# Patient Record
Sex: Female | Born: 1990 | Race: Black or African American | Hispanic: No | Marital: Single | Smoking: Never smoker
Health system: Southern US, Community
[De-identification: ages and names within clinical notes are randomized; demographics above are authoritative.]

## PROBLEM LIST (undated history)

## (undated) DIAGNOSIS — I319 Disease of pericardium, unspecified: Secondary | ICD-10-CM

## (undated) DIAGNOSIS — K831 Obstruction of bile duct: Secondary | ICD-10-CM

## (undated) DIAGNOSIS — R51 Headache: Secondary | ICD-10-CM

## (undated) DIAGNOSIS — R519 Headache, unspecified: Secondary | ICD-10-CM

## (undated) DIAGNOSIS — K859 Acute pancreatitis without necrosis or infection, unspecified: Secondary | ICD-10-CM

## (undated) DIAGNOSIS — I5189 Other ill-defined heart diseases: Secondary | ICD-10-CM

## (undated) DIAGNOSIS — I10 Essential (primary) hypertension: Secondary | ICD-10-CM

## (undated) HISTORY — PX: APPENDECTOMY: SHX54

---
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
## 2010-12-02 ENCOUNTER — Ambulatory Visit (HOSPITAL_COMMUNITY)
Admission: EM | Admit: 2010-12-02 | Discharge: 2010-12-03 | Disposition: A | Discharge status: Home / Self Care | Payer: Self-pay | Attending: General Surgery | Admitting: General Surgery

## 2010-12-02 ENCOUNTER — Emergency Department (HOSPITAL_COMMUNITY): Payer: Self-pay

## 2010-12-02 DIAGNOSIS — R112 Nausea with vomiting, unspecified: Secondary | ICD-10-CM | POA: Insufficient documentation

## 2010-12-02 DIAGNOSIS — K6389 Other specified diseases of intestine: Secondary | ICD-10-CM | POA: Insufficient documentation

## 2010-12-02 DIAGNOSIS — R197 Diarrhea, unspecified: Secondary | ICD-10-CM | POA: Insufficient documentation

## 2010-12-02 DIAGNOSIS — R109 Unspecified abdominal pain: Secondary | ICD-10-CM | POA: Insufficient documentation

## 2010-12-02 DIAGNOSIS — K5900 Constipation, unspecified: Secondary | ICD-10-CM | POA: Insufficient documentation

## 2010-12-02 DIAGNOSIS — Z01812 Encounter for preprocedural laboratory examination: Secondary | ICD-10-CM | POA: Insufficient documentation

## 2010-12-02 LAB — DIFFERENTIAL
Basophils Absolute: 0.1 10*3/uL (ref 0.0–0.1)
Eosinophils Absolute: 0.1 10*3/uL (ref 0.0–0.7)
Eosinophils Relative: 2 % (ref 0–5)
Monocytes Absolute: 0.7 10*3/uL (ref 0.1–1.0)
Neutrophils Relative %: 57 % (ref 43–77)

## 2010-12-02 LAB — CBC
Platelets: 210 10*3/uL (ref 150–400)
RDW: 12 % (ref 11.5–15.5)
WBC: 5.7 10*3/uL (ref 4.0–10.5)

## 2010-12-02 LAB — URINALYSIS, ROUTINE W REFLEX MICROSCOPIC
Bilirubin Urine: NEGATIVE
Ketones, ur: NEGATIVE mg/dL
Nitrite: NEGATIVE
pH: 5.5 (ref 5.0–8.0)

## 2010-12-02 LAB — WET PREP, GENITAL: Trich, Wet Prep: NONE SEEN

## 2010-12-02 LAB — COMPREHENSIVE METABOLIC PANEL
Alkaline Phosphatase: 42 U/L (ref 39–117)
BUN: 9 mg/dL (ref 6–23)
GFR calc non Af Amer: >60 mL/min (ref >60)
Glucose, Bld: 93 mg/dL (ref 70–99)
Potassium: 4.2 mEq/L (ref 3.5–5.1)
Total Bilirubin: 0.5 mg/dL (ref 0.3–1.2)
Total Protein: 7.4 g/dL (ref 6.0–8.3)

## 2010-12-02 LAB — POCT PREGNANCY, URINE: Preg Test, Ur: NEGATIVE

## 2010-12-03 ENCOUNTER — Other Ambulatory Visit: Payer: Self-pay | Admitting: General Surgery

## 2010-12-03 NOTE — H&P (Signed)
NAMETIMICA, MARCOM              ACCOUNT NO.:  [id number]  MEDICAL RECORD NO.:  [id number]           PATIENT TYPE:  E  LOCATION:  WLED                         FACILITY:  [facility]  PHYSICIAN:  Angelia Mould. Derrell Lolling, M.D.DATE OF BIRTH:  1991-08-20  DATE OF ADMISSION:  12/02/2010 DATE OF DISCHARGE:                             HISTORY & PHYSICAL   CHIEF COMPLAINT:  Lower abdominal pain for 24 hours; nausea and vomiting, chronic.  HISTORY:  This is a 20 year old African-American female who is basically quite healthy.  She says that for 6 to 12 months she has had some chronic nausea and occasional vomiting.  Her bowel movements are alternating constipation and diarrhea.  There has been really no change in this lately.  What is new is that she now has a 24-hour history of lower abdominal pain which is more right sided than left sided.  She has not had any fever or chills.  She is voiding normally.  She has no vaginal discharge.  She does not think that she could be pregnant.  She came to emergency room.  A CT scan was done which shows a somewhat thickened appendix with some periappendiceal fat and soft tissue stranding suggesting appendicitis.  There were no other masses, there was no abscess, there was no free fluid.  The uterus and ovaries looked normal. White blood cell count is normal.  Urinalysis is negative.  Urine pregnancy test is negative.  I was called to see her.  PAST MEDICAL HISTORY:  No history of any surgical or medical problems. She has never been pregnant.  MEDICATIONS:  None.  DRUG ALLERGIES:  None known.  FAMILY HISTORY:  Mother living, has hypertension.  Father living, has coronary artery disease, has not had a coronary artery bypass graft.  No real familial diseases.  SOCIAL HISTORY:  She is single.  She works at [employer] as a Nature conservation officer. She smokes marijuana once or twice a day.  Denies alcohol.  REVIEW OF SYMPTOMS:  Ten systems review of systems is performed.  It  is basically negative except as described above.  Specifically, no history of any urologic problems, gynecologic problems, pulmonary disease, diabetes.  She had not had any vaginal discharge.  PHYSICAL EXAMINATION:  GENERAL:  Healthy, somewhat athletic appearing woman in mild distress. VITAL SIGNS:  Temperature 98.6.  Initial pulse 107, now 88; respirations 18 and unlabored, blood pressure 145/95. HEENT:  Eyes, sclerae are clear.  Extraocular movements intact.  Ears, nose, mouth and throat; nose, lips, tongue and oropharynx without gross lesions. NECK:  Supple, nontender.  No mass, no jugular venous distention. LUNGS:  Clear to auscultation.  No chest wall tenderness. HEART:  Regular rate and rhythm.  No murmur.  No ectopy.  Radial and femoral pulses are palpable. BREASTS:  Not examined. ABDOMEN:  Soft.  Somewhat tender throughout the entire mid and lower abdomen but more so on the right and left with some guarding.  I do not feel a mass.  There is no hernia.  There are no scars noted.  There is no inguinal adenopathy or mass.  She moves all 4 extremities well without pain  or deformity. NEUROLOGIC:  No gross motor or sensory deficits.  DATE OF ADMISSION:  CT scan and lab work as discussed above.  ASSESSMENT: 1. Acute lower abdominal pain.  Considering the onset of pain and the     CT scan findings, there is moderate to high probability that this     is acute appendicitis.  Other possibilities would include     salpingitis, gastroenteritis, Meckel's diverticulum, these are felt     to be less likely. 2. Chronic irritable bowel syndrome with alternating diarrhea,     constipation and some nausea.  PLAN: 1. The patient be taken to operating room for diagnostic laparoscopy     and laparoscopic appendectomy. 2. I have discussed the indications and details of surgery with the     patient as well as her mother.  Differential diagnosis was     discussed.  They are aware that this is  probably appendicitis but     we may find something else wrong that may direct [place] to alter our     surgical plan while in the operating room.  Risks were discussed, including but not limited to bleeding, infection, conversion to open laparotomy, intestinal resection if necessary, injury to adjacent organs such as bladder or uterus or intestine with major reconstructive surgery, cardiac or pulmonary thromboembolic problems. The patient seems to understand these issues well.  This time all questions are answered.  She is in full agreement with this plan.     Angelia Mould. Derrell Lolling, M.D.     HMI/MEDQ  D:  12/03/2010  T:  12/03/2010  Job:  [id number]  Electronically Signed by Claud Kelp M.D. on 12/03/2010 06:49:52 AM

## 2010-12-06 NOTE — Op Note (Signed)
Chelsey Hall, Chelsey Hall              ACCOUNT NO.:  [id number]  MEDICAL RECORD NO.:  [id number]           PATIENT TYPE:  I  LOCATION:  1530                         FACILITY:  [facility]  PHYSICIAN:  Angelia Mould. Derrell Lolling, M.D.DATE OF BIRTH:  1991/08/25  DATE OF PROCEDURE:  12/03/2010 DATE OF DISCHARGE:                              OPERATIVE REPORT   PREOPERATIVE DIAGNOSIS:  Acute appendicitis.  POSTOPERATIVE DIAGNOSIS:  Acute appendicitis.  OPERATION PERFORMED:  Laparoscopic appendectomy.  SURGEON:  Angelia Mould. Derrell Lolling, M.D.  OPERATIVE INDICATIONS:  This is a 20 year old [place] American female who is generally healthy.  She presents with a 24-hour history of lower abdominal pain, but no fever, chills.  She has chronic problems with alternating diarrhea and constipation.  She has a 1-year history of intermittent mild nausea and occasional vomiting of unknown cause.  She has not sought medical attention.  She has no prior medical or surgical problems.  On exam, she has tenderness in the lower abdomen, more so on the right than in the left with some guarding.  CT scan shows a somewhat thickened appendix with a little bit of stranding around it.  Lab work including CBC, urine pregnancy test, and urinalysis were basically normal.  I felt that the clinical history and physical findings and CT scan were most consistent with appendicitis.  She was brought to operating room for diagnostic laparoscopy and probable appendectomy.  OPERATIVE FINDINGS:  The appendix was inflamed, a little bit edematous, a little bit swollen, certainly erythematous.  There was no evidence of perforation or gangrene.  The last 3 feet of terminal ileum looked basically normal, although there was a little bit of secondary inflammation just adjacent to the appendix.  The cecum and right colon looked normal.  The uterus and both fallopian tubes and both ovaries looked normal.  There was a little bit of watery cloudy fluid in  the pelvis, but this was not purulent.  There was no exudate.  Sigmoid colon looked normal.  The liver and gallbladder looked normal.  OPERATIVE TECHNIQUE:  Following the induction of general endotracheal anesthesia, Foley catheter was inserted.  Intravenous antibiotics were given.  The abdomen was prepped and draped in the sterile fashion. Surgical time-out was held identifying correct patient and correct procedure.  0.25% Marcaine with epinephrine was used as a local infiltration anesthetic.  A vertically oriented incision was made at the superior rim of the umbilicus.  Fascia was incised in the midline and the abdominal cavity entered under direct vision.  An 11 mm Hassan trocar was inserted and secured with a pursestring suture of 0 Vicryl.  Pneumoperitoneum was created.  Video cam was inserted with visualization and findings as described above.  A 5 mm trocar was placed in the left lower quadrant and a 12 mm trocar placed in the suprapubic area.  I thoroughly explored the upper abdomen, lower abdomen, and pelvis with findings as described above.  The only pathologic abnormalities seemed to be the appendix.  We elevated the appendix with a grasper.  We carefully took down the peritoneum around the appendix.  We divided the appendiceal mesentery and the  appendiceal artery at the harmonic scalpel.  He did this in several small steps until we had completely skeletonized the appendix and could clearly identify the junction of the appendix with the base of the cecum.  An Endo GIA stapler with a 2.5 cm staple height was inserted and placed transversely across the base of the appendix at the level of the cecum.  Once we had good visualization, we closed the stapler, held in place for about 30 seconds, fired and removed it.  The appendix was placed in a specimen bag and removed.  There were a few loose staples that were removed.  We irrigated the pericecal area and the pelvis and removed a  little bit of clear watery fluid.  There was no bleeding and no abnormality whatsoever.  The staple line looked very good.  The trocars were removed under direct vision.  There is no bleeding from the trocar sites.  The pneumoperitoneum was released.  The fascia at the umbilicus was closed with 0-Vicryl sutures.  The skin incisions were closed with subcuticular sutures of 4-0 Monocryl and Dermabond.  The patient tolerated the procedure well and was taken to recovery room in stable condition.  Estimated blood loss was about 10 cc.  Complications None.  Sponge, needle, and instrument counts were correct.     Angelia Mould. Derrell Lolling, M.D.     HMI/MEDQ  D:  12/03/2010  T:  12/03/2010  Job:  [id number]  Electronically Signed by Claud Kelp M.D. on 12/06/2010 10:18:31 AM

## 2012-03-25 ENCOUNTER — Emergency Department (HOSPITAL_BASED_OUTPATIENT_CLINIC_OR_DEPARTMENT_OTHER): Payer: Self-pay

## 2012-03-25 ENCOUNTER — Encounter (HOSPITAL_BASED_OUTPATIENT_CLINIC_OR_DEPARTMENT_OTHER): Payer: Self-pay | Admitting: *Deleted

## 2012-03-25 ENCOUNTER — Emergency Department (HOSPITAL_BASED_OUTPATIENT_CLINIC_OR_DEPARTMENT_OTHER)
Admission: EM | Admit: 2012-03-25 | Discharge: 2012-03-25 | Disposition: A | Discharge status: Home / Self Care | Payer: Self-pay | Attending: Emergency Medicine | Admitting: Emergency Medicine

## 2012-03-25 DIAGNOSIS — N390 Urinary tract infection, site not specified: Secondary | ICD-10-CM | POA: Insufficient documentation

## 2012-03-25 DIAGNOSIS — R109 Unspecified abdominal pain: Secondary | ICD-10-CM | POA: Insufficient documentation

## 2012-03-25 DIAGNOSIS — R112 Nausea with vomiting, unspecified: Secondary | ICD-10-CM | POA: Insufficient documentation

## 2012-03-25 LAB — URINALYSIS, ROUTINE W REFLEX MICROSCOPIC
Bilirubin Urine: NEGATIVE
Glucose, UA: NEGATIVE mg/dL
Ketones, ur: 15 mg/dL — AB
Specific Gravity, Urine: 1.027 (ref 1.005–1.030)
pH: 6 (ref 5.0–8.0)

## 2012-03-25 LAB — CBC WITH DIFFERENTIAL/PLATELET
Basophils Absolute: 0 10*3/uL (ref 0.0–0.1)
Basophils Relative: 0 % (ref 0–1)
Eosinophils Relative: 1 % (ref 0–5)
Lymphocytes Relative: 40 % (ref 12–46)
MCHC: 36.4 g/dL — ABNORMAL HIGH (ref 30.0–36.0)
MCV: 87.8 fL (ref 78.0–100.0)
Monocytes Absolute: 0.4 10*3/uL (ref 0.1–1.0)
Platelets: 193 10*3/uL (ref 150–400)
RDW: 11.3 % — ABNORMAL LOW (ref 11.5–15.5)
WBC: 3.7 10*3/uL — ABNORMAL LOW (ref 4.0–10.5)

## 2012-03-25 LAB — URINE MICROSCOPIC-ADD ON

## 2012-03-25 LAB — COMPREHENSIVE METABOLIC PANEL
ALT: 14 U/L (ref 0–35)
AST: 15 U/L (ref 0–37)
Albumin: 4.7 g/dL (ref 3.5–5.2)
CO2: 24 mEq/L (ref 19–32)
Calcium: 9.5 mg/dL (ref 8.4–10.5)
Creatinine, Ser: 0.8 mg/dL (ref 0.50–1.10)
GFR calc non Af Amer: >90 mL/min (ref >90)
Sodium: 138 mEq/L (ref 135–145)
Total Protein: 7.5 g/dL (ref 6.0–8.3)

## 2012-03-25 MED ORDER — OMEPRAZOLE 20 MG PO CPDR: 20.0000 mg | DELAYED_RELEASE_CAPSULE | Freq: Every day | ORAL | Status: DC

## 2012-03-25 MED ORDER — NITROFURANTOIN MONOHYD MACRO 100 MG PO CAPS: 100.0000 mg | ORAL_CAPSULE | Freq: Two times a day (BID) | ORAL | Status: AC

## 2012-03-25 NOTE — ED Provider Notes (Signed)
Medical screening examination/treatment/procedure(s) were performed by non-physician practitioner and as supervising physician I was immediately available for consultation/collaboration.   Glynn Octave, MD 03/25/12 [phone]

## 2012-03-25 NOTE — ED Notes (Signed)
Pt. Is in [place] for test ordered by EDP

## 2012-03-25 NOTE — ED Notes (Signed)
Pt. Reports normal periods with no discharge and no odor.  Pt. Reports no frequency.  Pt. Reports last sexual encounter was in Feb. 2013

## 2012-03-25 NOTE — ED Provider Notes (Signed)
Medical screening examination/treatment/procedure(s) were performed by non-physician practitioner and as supervising physician I was immediately available for consultation/collaboration.   Glynn Octave, MD 03/25/12 1750

## 2012-03-25 NOTE — ED Provider Notes (Addendum)
History     CSN: [id number]  Arrival date & time 03/25/12  1227   First MD Initiated Contact with Patient 03/25/12 1320      Chief Complaint  Patient presents with  . Abdominal Pain    mid abd. pain per Pt. has been hurting for approx. 5-6 mths with noted back pain also.    (Consider location/radiation/quality/duration/timing/severity/associated sxs/prior treatment) Patient is a 21 y.o. female presenting with abdominal pain. The history is provided by the patient. No language interpreter was used.  Abdominal Pain The primary symptoms of the illness include abdominal pain, nausea and vomiting. Episode onset: on and off for 7 months. The onset of the illness was gradual. The problem has been gradually worsening.  The patient states that she believes she is currently not pregnant. Significant associated medical issues do not include PUD, GERD or gallstones.    No past medical history on file.  Past Surgical History  Procedure Date  . Appendectomy     No family history on file.  History  Substance Use Topics  . Smoking status: Not on file  . Smokeless tobacco: Not on file  . Alcohol Use:     OB History    Grav Para Term Preterm Abortions TAB SAB Ect Mult Living                  Review of Systems  Unable to perform ROS Gastrointestinal: Positive for nausea, vomiting and abdominal pain.  All other systems reviewed and are negative.    Allergies  Review of patient's allergies indicates no known allergies.  Home Medications  No current outpatient prescriptions on file.  LMP 03/07/2012  Physical Exam  Nursing note and vitals reviewed. Constitutional: She is oriented to person, place, and time. She appears well-developed and well-nourished.  HENT:  Head: Normocephalic.  Right Ear: External ear normal.  Left Ear: External ear normal.  Eyes: Conjunctivae are normal. Pupils are equal, round, and reactive to light.  Neck: Normal range of motion. Neck supple.    Cardiovascular: Normal rate and regular rhythm.   Pulmonary/Chest: Effort normal and breath sounds normal.  Abdominal: Soft. There is tenderness.  Musculoskeletal: Normal range of motion.  Neurological: She is alert and oriented to person, place, and time. She has normal reflexes.  Skin: Skin is warm.  Psychiatric: She has a normal mood and affect.    ED Course  Procedures (including critical care time)   Labs Reviewed  PREGNANCY, URINE  URINALYSIS, ROUTINE W REFLEX MICROSCOPIC   No results found. Results for orders placed during the hospital encounter of 03/25/12  URINALYSIS, ROUTINE W REFLEX MICROSCOPIC      Component Value Range   Color, Urine YELLOW  YELLOW   APPearance CLOUDY (*) CLEAR   Specific Gravity, Urine 1.027  1.005 - 1.030   pH 6.0  5.0 - 8.0   Glucose, UA NEGATIVE  NEGATIVE mg/dL   Hgb urine dipstick NEGATIVE  NEGATIVE   Bilirubin Urine NEGATIVE  NEGATIVE   Ketones, ur 15 (*) NEGATIVE mg/dL   Protein, ur NEGATIVE  NEGATIVE mg/dL   Urobilinogen, UA 0.2  0.0 - 1.0 mg/dL   Nitrite NEGATIVE  NEGATIVE   Leukocytes, UA MODERATE (*) NEGATIVE  PREGNANCY, URINE      Component Value Range   Preg Test, Ur NEGATIVE  NEGATIVE  URINE MICROSCOPIC-ADD ON      Component Value Range   Squamous Epithelial / LPF MANY (*) RARE   WBC, UA 11-20  <3  WBC/hpf   RBC / HPF 0-2  <3 RBC/hpf   Bacteria, UA MANY (*) RARE   Urine-Other MUCOUS PRESENT    CBC WITH DIFFERENTIAL      Component Value Range   WBC 3.7 (*) 4.0 - 10.5 K/uL   RBC 4.03  3.87 - 5.11 MIL/uL   Hemoglobin 12.9  12.0 - 15.0 g/dL   HCT 78.2 (*) 95.6 - 21.3 %   MCV 87.8  78.0 - 100.0 fL   MCH 32.0  26.0 - 34.0 pg   MCHC 36.4 (*) 30.0 - 36.0 g/dL   RDW 08.6 (*) 57.8 - 46.9 %   Platelets 193  150 - 400 K/uL   Neutrophils Relative 49  43 - 77 %   Neutro Abs 1.8  1.7 - 7.7 K/uL   Lymphocytes Relative 40  12 - 46 %   Lymphs Abs 1.5  0.7 - 4.0 K/uL   Monocytes Relative 10  3 - 12 %   Monocytes Absolute 0.4  0.1 -  1.0 K/uL   Eosinophils Relative 1  0 - 5 %   Eosinophils Absolute 0.0  0.0 - 0.7 K/uL   Basophils Relative 0  0 - 1 %   Basophils Absolute 0.0  0.0 - 0.1 K/uL  COMPREHENSIVE METABOLIC PANEL      Component Value Range   Sodium 138  135 - 145 mEq/L   Potassium 3.6  3.5 - 5.1 mEq/L   Chloride 104  96 - 112 mEq/L   CO2 24  19 - 32 mEq/L   Glucose, Bld 97  70 - 99 mg/dL   BUN 9  6 - 23 mg/dL   Creatinine, Ser 6.29  0.50 - 1.10 mg/dL   Calcium 9.5  8.4 - 52.8 mg/dL   Total Protein 7.5  6.0 - 8.3 g/dL   Albumin 4.7  3.5 - 5.2 g/dL   AST 15  0 - 37 U/L   ALT 14  0 - 35 U/L   Alkaline Phosphatase 40  39 - 117 U/L   Total Bilirubin 0.7  0.3 - 1.2 mg/dL   GFR calc non Af Amer >90  >90 mL/min   GFR calc Af Amer >90  >90 mL/min  LIPASE, BLOOD      Component Value Range   Lipase 32  11 - 59 U/L   No results found.   No diagnosis found.    MDM     Results for orders placed during the hospital encounter of 03/25/12  URINALYSIS, ROUTINE W REFLEX MICROSCOPIC      Component Value Range   Color, Urine YELLOW  YELLOW   APPearance CLOUDY (*) CLEAR   Specific Gravity, Urine 1.027  1.005 - 1.030   pH 6.0  5.0 - 8.0   Glucose, UA NEGATIVE  NEGATIVE mg/dL   Hgb urine dipstick NEGATIVE  NEGATIVE   Bilirubin Urine NEGATIVE  NEGATIVE   Ketones, ur 15 (*) NEGATIVE mg/dL   Protein, ur NEGATIVE  NEGATIVE mg/dL   Urobilinogen, UA 0.2  0.0 - 1.0 mg/dL   Nitrite NEGATIVE  NEGATIVE   Leukocytes, UA MODERATE (*) NEGATIVE  PREGNANCY, URINE      Component Value Range   Preg Test, Ur NEGATIVE  NEGATIVE  URINE MICROSCOPIC-ADD ON      Component Value Range   Squamous Epithelial / LPF MANY (*) RARE   WBC, UA 11-20  <3 WBC/hpf   RBC / HPF 0-2  <3 RBC/hpf   Bacteria, UA MANY (*)  RARE   Urine-Other MUCOUS PRESENT    CBC WITH DIFFERENTIAL      Component Value Range   WBC 3.7 (*) 4.0 - 10.5 K/uL   RBC 4.03  3.87 - 5.11 MIL/uL   Hemoglobin 12.9  12.0 - 15.0 g/dL   HCT 95.6 (*) 21.3 - 08.6 %   MCV  87.8  78.0 - 100.0 fL   MCH 32.0  26.0 - 34.0 pg   MCHC 36.4 (*) 30.0 - 36.0 g/dL   RDW 57.8 (*) 46.9 - 62.9 %   Platelets 193  150 - 400 K/uL   Neutrophils Relative 49  43 - 77 %   Neutro Abs 1.8  1.7 - 7.7 K/uL   Lymphocytes Relative 40  12 - 46 %   Lymphs Abs 1.5  0.7 - 4.0 K/uL   Monocytes Relative 10  3 - 12 %   Monocytes Absolute 0.4  0.1 - 1.0 K/uL   Eosinophils Relative 1  0 - 5 %   Eosinophils Absolute 0.0  0.0 - 0.7 K/uL   Basophils Relative 0  0 - 1 %   Basophils Absolute 0.0  0.0 - 0.1 K/uL  COMPREHENSIVE METABOLIC PANEL      Component Value Range   Sodium 138  135 - 145 mEq/L   Potassium 3.6  3.5 - 5.1 mEq/L   Chloride 104  96 - 112 mEq/L   CO2 24  19 - 32 mEq/L   Glucose, Bld 97  70 - 99 mg/dL   BUN 9  6 - 23 mg/dL   Creatinine, Ser 5.28  0.50 - 1.10 mg/dL   Calcium 9.5  8.4 - 41.3 mg/dL   Total Protein 7.5  6.0 - 8.3 g/dL   Albumin 4.7  3.5 - 5.2 g/dL   AST 15  0 - 37 U/L   ALT 14  0 - 35 U/L   Alkaline Phosphatase 40  39 - 117 U/L   Total Bilirubin 0.7  0.3 - 1.2 mg/dL   GFR calc non Af Amer >90  >90 mL/min   GFR calc Af Amer >90  >90 mL/min  LIPASE, BLOOD      Component Value Range   Lipase 32  11 - 59 U/L   US Abdomen Complete  03/25/2012  *RADIOLOGY REPORT*  Clinical Data:  Upper abdominal pain, prior appendectomy  ABDOMINAL ULTRASOUND COMPLETE  Comparison:  12/02/2010 CT without contrast  Findings:  Gallbladder:  No gallstones, gallbladder wall thickening, or pericholecystic fluid.  Common Bile Duct:  Within normal limits in caliber.  Liver: Diffuse increased echogenicity compatible with mild hepatic steatosis.  No focal abnormality or biliary dilatation.  IVC:  Appears normal.  Pancreas:  No abnormality identified.  Spleen:  Within normal limits in size and echotexture.  Right kidney:  Normal in size and parenchymal echogenicity.  No evidence of mass or hydronephrosis.  Left kidney:  Normal in size and parenchymal echogenicity.  No evidence of mass or  hydronephrosis.  Abdominal Aorta:  No aneurysm identified.  IMPRESSION: Mild hepatic steatosis or fatty infiltration.  No acute finding by ultrasound.  Original Report Authenticated By: Judie Petit. Ruel Favors, M.D.     Ultrasound is normal.   I will treat with prilosec for symptoms.   Pt also treated for uti with macrobid.   Pt given referral to Dr. Rodena Medin if symptoms persist   Lonia Skinner [place], [place] 03/25/12 1458  Lonia Skinner [place], [place] 03/25/12 [phone]

## 2012-04-15 ENCOUNTER — Emergency Department (HOSPITAL_COMMUNITY)
Admission: EM | Admit: 2012-04-15 | Discharge: 2012-04-16 | Disposition: A | Discharge status: Home / Self Care | Payer: Self-pay | Attending: Emergency Medicine | Admitting: Emergency Medicine

## 2012-04-15 ENCOUNTER — Encounter (HOSPITAL_COMMUNITY): Payer: Self-pay | Admitting: Emergency Medicine

## 2012-04-15 ENCOUNTER — Emergency Department (HOSPITAL_COMMUNITY): Payer: Self-pay

## 2012-04-15 DIAGNOSIS — R109 Unspecified abdominal pain: Secondary | ICD-10-CM | POA: Insufficient documentation

## 2012-04-15 DIAGNOSIS — R112 Nausea with vomiting, unspecified: Secondary | ICD-10-CM | POA: Insufficient documentation

## 2012-04-15 DIAGNOSIS — Z9089 Acquired absence of other organs: Secondary | ICD-10-CM | POA: Insufficient documentation

## 2012-04-15 DIAGNOSIS — F17200 Nicotine dependence, unspecified, uncomplicated: Secondary | ICD-10-CM | POA: Insufficient documentation

## 2012-04-15 LAB — URINALYSIS, ROUTINE W REFLEX MICROSCOPIC
Bilirubin Urine: NEGATIVE
Hgb urine dipstick: NEGATIVE
Nitrite: NEGATIVE
Protein, ur: NEGATIVE mg/dL
Urobilinogen, UA: 0.2 mg/dL (ref 0.0–1.0)

## 2012-04-15 LAB — COMPREHENSIVE METABOLIC PANEL
ALT: 11 U/L (ref 0–35)
AST: 12 U/L (ref 0–37)
CO2: 22 mEq/L (ref 19–32)
Calcium: 9.6 mg/dL (ref 8.4–10.5)
Chloride: 102 mEq/L (ref 96–112)
Creatinine, Ser: 0.72 mg/dL (ref 0.50–1.10)
GFR calc Af Amer: >90 mL/min (ref >90)
GFR calc non Af Amer: >90 mL/min (ref >90)
Glucose, Bld: 92 mg/dL (ref 70–99)
Total Bilirubin: 0.4 mg/dL (ref 0.3–1.2)

## 2012-04-15 LAB — CBC WITH DIFFERENTIAL/PLATELET
Eosinophils Absolute: 0 10*3/uL (ref 0.0–0.7)
Eosinophils Relative: 0 % (ref 0–5)
HCT: 35.9 % — ABNORMAL LOW (ref 36.0–46.0)
Hemoglobin: 13.1 g/dL (ref 12.0–15.0)
Lymphocytes Relative: 12 % (ref 12–46)
Lymphs Abs: 0.7 10*3/uL (ref 0.7–4.0)
MCH: 32 pg (ref 26.0–34.0)
MCV: 87.8 fL (ref 78.0–100.0)
Monocytes Absolute: 0.6 10*3/uL (ref 0.1–1.0)
Monocytes Relative: 10 % (ref 3–12)
Platelets: 184 10*3/uL (ref 150–400)
RBC: 4.09 MIL/uL (ref 3.87–5.11)
WBC: 5.8 10*3/uL (ref 4.0–10.5)

## 2012-04-15 LAB — LIPASE, BLOOD: Lipase: 25 U/L (ref 11–59)

## 2012-04-15 MED ORDER — DICYCLOMINE HCL 10 MG/ML IM SOLN
20.0000 mg | Freq: Once | INTRAMUSCULAR | Status: AC
  Administered 2012-04-15: 20 mg via INTRAMUSCULAR
  Filled 2012-04-15: qty 2

## 2012-04-15 MED ORDER — POTASSIUM CHLORIDE CRYS ER 20 MEQ PO TBCR
20.0000 meq | EXTENDED_RELEASE_TABLET | Freq: Once | ORAL | Status: AC
  Administered 2012-04-16: 20 meq via ORAL
  Filled 2012-04-15: qty 1

## 2012-04-15 MED ORDER — ONDANSETRON HCL 4 MG/2ML IJ SOLN
4.0000 mg | Freq: Once | INTRAMUSCULAR | Status: AC
  Administered 2012-04-15: 4 mg via INTRAVENOUS
  Filled 2012-04-15: qty 2

## 2012-04-15 MED ORDER — SODIUM CHLORIDE 0.9 % IV SOLN
INTRAVENOUS | Status: DC
  Administered 2012-04-15: 22:00:00 via INTRAVENOUS

## 2012-04-15 MED ORDER — HYDROMORPHONE HCL PF 1 MG/ML IJ SOLN
1.0000 mg | Freq: Once | INTRAMUSCULAR | Status: AC
  Administered 2012-04-15: 1 mg via INTRAVENOUS
  Filled 2012-04-15: qty 1

## 2012-04-15 NOTE — ED Notes (Signed)
Pt states she is having abd pain  Pt states she had her appendix removed a year ago and has been having pain off and on ever since  Pt states she was seen here Aug 1st and was diagnosed with a UTI and was given antibiotics  Pt states it cleared up but now she is having pain in her abd and having N/V/D  Pt states she is unable to keep anything down

## 2012-04-16 ENCOUNTER — Emergency Department (HOSPITAL_COMMUNITY)
Admission: EM | Admit: 2012-04-16 | Discharge: 2012-04-16 | Disposition: A | Discharge status: Home / Self Care | Payer: Self-pay | Attending: Emergency Medicine | Admitting: Emergency Medicine

## 2012-04-16 ENCOUNTER — Encounter (HOSPITAL_COMMUNITY): Payer: Self-pay | Admitting: *Deleted

## 2012-04-16 DIAGNOSIS — G8929 Other chronic pain: Secondary | ICD-10-CM | POA: Insufficient documentation

## 2012-04-16 DIAGNOSIS — Z9089 Acquired absence of other organs: Secondary | ICD-10-CM | POA: Insufficient documentation

## 2012-04-16 DIAGNOSIS — R1011 Right upper quadrant pain: Secondary | ICD-10-CM | POA: Insufficient documentation

## 2012-04-16 LAB — CBC WITH DIFFERENTIAL/PLATELET
HCT: 34.8 % — ABNORMAL LOW (ref 36.0–46.0)
Hemoglobin: 12.4 g/dL (ref 12.0–15.0)
Lymphocytes Relative: 10 % — ABNORMAL LOW (ref 12–46)
MCHC: 35.6 g/dL (ref 30.0–36.0)
Monocytes Absolute: 0.5 10*3/uL (ref 0.1–1.0)
Monocytes Relative: 10 % (ref 3–12)
Neutro Abs: 4 10*3/uL (ref 1.7–7.7)
WBC: 5 10*3/uL (ref 4.0–10.5)

## 2012-04-16 LAB — URINALYSIS, ROUTINE W REFLEX MICROSCOPIC
Bilirubin Urine: NEGATIVE
Glucose, UA: NEGATIVE mg/dL
Ketones, ur: >80 mg/dL — AB
pH: 6 (ref 5.0–8.0)

## 2012-04-16 LAB — BASIC METABOLIC PANEL
BUN: 7 mg/dL (ref 6–23)
CO2: 21 mEq/L (ref 19–32)
Chloride: 102 mEq/L (ref 96–112)
Creatinine, Ser: 0.72 mg/dL (ref 0.50–1.10)

## 2012-04-16 LAB — URINE MICROSCOPIC-ADD ON

## 2012-04-16 LAB — HEPATIC FUNCTION PANEL
ALT: 11 U/L (ref 0–35)
Albumin: 3.9 g/dL (ref 3.5–5.2)
Indirect Bilirubin: 0.4 mg/dL (ref 0.3–0.9)
Total Protein: 7.2 g/dL (ref 6.0–8.3)

## 2012-04-16 MED ORDER — ONDANSETRON HCL 4 MG/2ML IJ SOLN
4.0000 mg | Freq: Once | INTRAMUSCULAR | Status: AC
  Administered 2012-04-16: 4 mg via INTRAVENOUS
  Filled 2012-04-16: qty 2

## 2012-04-16 MED ORDER — HYDROMORPHONE HCL PF 1 MG/ML IJ SOLN
1.0000 mg | Freq: Once | INTRAMUSCULAR | Status: AC
  Administered 2012-04-16: 1 mg via INTRAVENOUS
  Filled 2012-04-16: qty 1

## 2012-04-16 MED ORDER — IOHEXOL 300 MG/ML  SOLN
100.0000 mL | Freq: Once | INTRAMUSCULAR | Status: AC | PRN
  Administered 2012-04-16: 100 mL via INTRAVENOUS

## 2012-04-16 MED ORDER — METOCLOPRAMIDE HCL 5 MG/ML IJ SOLN
10.0000 mg | Freq: Once | INTRAMUSCULAR | Status: AC
  Administered 2012-04-16: 10 mg via INTRAVENOUS
  Filled 2012-04-16: qty 2

## 2012-04-16 MED ORDER — ONDANSETRON 4 MG PO TBDP
ORAL_TABLET | ORAL | Status: AC
  Filled 2012-04-16: qty 1

## 2012-04-16 MED ORDER — HYOSCYAMINE SULFATE 0.125 MG PO TABS
0.2500 mg | ORAL_TABLET | Freq: Once | ORAL | Status: AC
  Administered 2012-04-16: 0.25 mg via ORAL
  Filled 2012-04-16: qty 1

## 2012-04-16 MED ORDER — PANTOPRAZOLE SODIUM 40 MG IV SOLR
40.0000 mg | Freq: Once | INTRAVENOUS | Status: AC
  Administered 2012-04-16: 40 mg via INTRAVENOUS
  Filled 2012-04-16: qty 40

## 2012-04-16 MED ORDER — SODIUM CHLORIDE 0.9 % IV BOLUS (SEPSIS): 1000.0000 mL | Freq: Once | INTRAVENOUS | Status: DC

## 2012-04-16 MED ORDER — HYDROCODONE-ACETAMINOPHEN 5-500 MG PO TABS: 1.0000 | ORAL_TABLET | Freq: Four times a day (QID) | ORAL | Status: DC | PRN

## 2012-04-16 MED ORDER — POTASSIUM CHLORIDE CRYS ER 20 MEQ PO TBCR
40.0000 meq | EXTENDED_RELEASE_TABLET | Freq: Once | ORAL | Status: AC
  Administered 2012-04-16: 40 meq via ORAL
  Filled 2012-04-16: qty 2

## 2012-04-16 MED ORDER — ONDANSETRON HCL 4 MG PO TABS: 4.0000 mg | ORAL_TABLET | Freq: Four times a day (QID) | ORAL | Status: DC

## 2012-04-16 MED ORDER — SODIUM CHLORIDE 0.9 % IR SOLN: 1000.0000 mL | Freq: Once | Status: DC

## 2012-04-16 MED ORDER — ONDANSETRON 4 MG PO TBDP
4.0000 mg | ORAL_TABLET | Freq: Once | ORAL | Status: AC
  Administered 2012-04-16: 4 mg via ORAL

## 2012-04-16 MED ORDER — DIPHENHYDRAMINE HCL 50 MG/ML IJ SOLN
25.0000 mg | Freq: Once | INTRAMUSCULAR | Status: AC
  Administered 2012-04-16: 25 mg via INTRAVENOUS
  Filled 2012-04-16: qty 1

## 2012-04-16 MED ORDER — GI COCKTAIL ~~LOC~~
30.0000 mL | Freq: Once | ORAL | Status: AC
  Administered 2012-04-16: 30 mL via ORAL
  Filled 2012-04-16: qty 30

## 2012-04-16 NOTE — ED Provider Notes (Signed)
History     CSN: [id number]  Arrival date & time 04/15/12  2012   First MD Initiated Contact with Patient 04/15/12 2121      Chief Complaint  Patient presents with  . Headache  . Abdominal Pain    (Consider location/radiation/quality/duration/timing/severity/associated sxs/prior treatment) HPI  Pt to the ER with complaints of bilateral upper quadrant abdominal pains. She states that they have been ongoing and are nothing new. She states she came to the ER again for this because the pain is lasting longer than her normal episodes. Their is associated nasuea and vomiting. Her appendix was removed 1 year ago. Pt diagnosed and treated for UTI 1 month ago. Denies dysuria, diarrhea, lower quadrant abdominal pain. Pt is in no distress but appears uncomfortable. She also has a low grade fever.  History reviewed. No pertinent past medical history.  Past Surgical History  Procedure Date  . Appendectomy     Family History  Problem Relation Age of Onset  . Hypertension Mother     History  Substance Use Topics  . Smoking status: Current Some Day Smoker    Types: Cigarettes  . Smokeless tobacco: Not on file  . Alcohol Use: Yes    OB History    Grav Para Term Preterm Abortions TAB SAB Ect Mult Living                  Review of Systems  All other systems reviewed and are negative.     Allergies  Review of patient's allergies indicates no known allergies.  Home Medications  No current outpatient prescriptions on file.  BP 119/105  Pulse 105  Temp 100.1 F (37.8 C) (Oral)  Resp 16  SpO2 100%  LMP 03/26/2012  Physical Exam  Nursing note and vitals reviewed. Constitutional: She appears well-developed and well-nourished. No distress.  HENT:  Head: Normocephalic and atraumatic.  Eyes: Pupils are equal, round, and reactive to light.  Neck: Normal range of motion. Neck supple.  Cardiovascular: Normal rate and regular rhythm.   Pulmonary/Chest: Effort normal.    Abdominal: Soft. She exhibits no distension and no mass. There is tenderness. There is no rebound and no guarding (RUQ and LUQ).  Neurological: She is alert.  Skin: Skin is warm and dry.    ED Course  Procedures (including critical care time)  Labs Reviewed  CBC WITH DIFFERENTIAL - Abnormal; Notable for the following:    HCT 35.9 (*)     MCHC 36.5 (*)     Neutrophils Relative 78 (*)     All other components within normal limits  COMPREHENSIVE METABOLIC PANEL - Abnormal; Notable for the following:    Potassium 3.2 (*)     All other components within normal limits  URINALYSIS, ROUTINE W REFLEX MICROSCOPIC - Abnormal; Notable for the following:    APPearance CLOUDY (*)     Ketones, ur >80 (*)     All other components within normal limits  LIPASE, BLOOD  POCT PREGNANCY, URINE   Ct Abdomen Pelvis W Contrast  04/16/2012  *RADIOLOGY REPORT*  Clinical Data: Abdominal pain with nausea and vomiting.  CT ABDOMEN AND PELVIS WITH CONTRAST  Technique:  Multidetector CT imaging of the abdomen and pelvis was performed following the standard protocol during bolus administration of intravenous contrast.  Contrast: OMNIPAQUE IOHEXOL 300 MG/ML  SOLN  Comparison: CT 12/02/2010  Findings: 5.4 cm cyst projects into the left lung base from the posterior mediastinum.  This is lateral to the aorta  and esophagus. 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  This was present previously but was not completely scanned on the prior study.  This is most likely an incidental finding.  Liver gallbladder and spleen are normal.  Pancreas and kidneys are normal.  Negative for bowel obstruction or bowel thickening.  The appendix has been removed.  3 cm corpus luteum cyst on the right with moderate free fluid in the pelvis.  The uterus is normal.  Left ovary is normal.  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 Bronchogenic cyst also considered.  Negative for bowel obstruction or bowel thickening.  Corpus luteum cyst on the right with free fluid in the pelvis.   Original Report Authenticated By: Camelia Phenes, M.D.      1. Abdominal pain   2. Nausea & vomiting       MDM  Patients labs show no significant abnormalities. CT scan also shows no significant abnormalities. Pt needs to see a GI for these chronic problems. Referral and pain medications given. Pain managed in the ED, abdomen re-evaluated before discharge and she is pain free and tolerating oral fluids.  Pt has been advised of the symptoms that warrant their return to the ED. Patient has voiced understanding and has agreed to follow-up with the PCP or specialist.         Dorthula Matas, PA 04/16/12 [phone]

## 2012-04-16 NOTE — ED Provider Notes (Signed)
Medical screening examination/treatment/procedure(s) were conducted as a shared visit with non-physician practitioner(s) and myself.  I personally evaluated the patient during the encounter  Cheri Guppy, MD 04/16/12 [phone]

## 2012-04-16 NOTE — ED Provider Notes (Signed)
History     CSN: [id number]  Arrival date & time 04/16/12  4098   First MD Initiated Contact with Patient 04/16/12 1007      Chief Complaint  Patient presents with  . Abdominal Pain  . Rectal Bleeding    (Consider location/radiation/quality/duration/timing/severity/associated sxs/prior treatment) HPI Comments: Chelsey Hall 21 y.o. female   The chief complaint is: Patient presents with:   Abdominal Pain   Rectal Bleeding   The patient has medical history significant for:   No past medical history on file.  Patient was seen yesterday in ED with the same chief cmplain RUQ pain. She returns today with the same complaint and rates the pain 10/10. Associated symptoms include subjective fevers, nausea,vomiting, diarrhea with dark tarry stools,and 1 week of anorexia. Imaging done on 04/15/12 show no evidence of gallstones, pancreatitis, or other cause of acute abdominal pain. Denies chills or night sweats. Denies CP, SOB, or palpitations. History significant for appendectomy in 2012.      The history is provided by the patient.    No past medical history on file.  Past Surgical History  Procedure Date  . Appendectomy     Family History  Problem Relation Age of Onset  . Hypertension Mother     History  Substance Use Topics  . Smoking status: Current Some Day Smoker    Types: Cigarettes  . Smokeless tobacco: Not on file  . Alcohol Use: Yes    OB History    Grav Para Term Preterm Abortions TAB SAB Ect Mult Living                  Review of Systems  Constitutional: Positive for fever. Negative for chills and diaphoresis.  Respiratory: Negative for shortness of breath.   Cardiovascular: Negative for chest pain and palpitations.  Gastrointestinal: Positive for nausea, vomiting, abdominal pain, diarrhea, blood in stool and anal bleeding. Negative for abdominal distention and rectal pain.  All other systems reviewed and are negative.    Allergies  Review of  patient's allergies indicates no known allergies.  Home Medications   Current Outpatient Rx  Name Route Sig Dispense Refill  . ACETAMINOPHEN 500 MG PO TABS Oral Take 1,000 mg by mouth every 6 (six) hours as needed. For pain    . HYDROCODONE-ACETAMINOPHEN 5-500 MG PO TABS Oral Take 1-2 tablets by mouth every 6 (six) hours as needed for pain. 15 tablet 0  . OMEPRAZOLE 20 MG PO CPDR Oral Take 20 mg by mouth daily.    Marland Kitchen ONDANSETRON HCL 4 MG PO TABS Oral Take 1 tablet (4 mg total) by mouth every 6 (six) hours. 12 tablet 0    BP 143/89  Pulse 83  Temp 99.1 F (37.3 C) (Oral)  Resp 18  SpO2 100%  LMP 03/26/2012  Physical Exam  Nursing note and vitals reviewed. Constitutional: She appears well-developed and well-nourished. She appears distressed.  HENT:  Head: Normocephalic and atraumatic.  Mouth/Throat: Oropharynx is clear and moist.  Eyes: Conjunctivae and EOM are normal. No scleral icterus.  Neck: Normal range of motion. Neck supple.  Cardiovascular: Normal rate, regular rhythm and normal heart sounds.   Pulmonary/Chest: Effort normal and breath sounds normal.  Abdominal: Soft. Bowel sounds are normal. There is tenderness in the right upper quadrant. There is negative Murphy's sign.  Skin: Skin is warm and dry.    ED Course  Procedures (including critical care time)  Labs Reviewed  CBC WITH DIFFERENTIAL - Abnormal; Notable for the following:  HCT 34.8 (*)     Neutrophils Relative 80 (*)     Lymphocytes Relative 10 (*)     Lymphs Abs 0.5 (*)     All other components within normal limits  BASIC METABOLIC PANEL - Abnormal; Notable for the following:    Potassium 3.1 (*)     All other components within normal limits  URINALYSIS, ROUTINE W REFLEX MICROSCOPIC - Abnormal; Notable for the following:    APPearance CLOUDY (*)     Ketones, ur >80 (*)     Protein, ur 30 (*)     All other components within normal limits  URINE MICROSCOPIC-ADD ON - Abnormal; Notable for the  following:    Squamous Epithelial / LPF FEW (*)     Bacteria, UA FEW (*)     All other components within normal limits  HEPATIC FUNCTION PANEL  LIPASE, BLOOD  OCCULT BLOOD, POC DEVICE   Results for orders placed during the hospital encounter of 04/16/12  HEPATIC FUNCTION PANEL      Component Value Range   Total Protein 7.2  6.0 - 8.3 g/dL   Albumin 3.9  3.5 - 5.2 g/dL   AST 18  0 - 37 U/L   ALT 11  0 - 35 U/L   Alkaline Phosphatase 39  39 - 117 U/L   Total Bilirubin 0.5  0.3 - 1.2 mg/dL   Bilirubin, Direct 0.1  0.0 - 0.3 mg/dL   Indirect Bilirubin 0.4  0.3 - 0.9 mg/dL  CBC WITH DIFFERENTIAL      Component Value Range   WBC 5.0  4.0 - 10.5 K/uL   RBC 3.97  3.87 - 5.11 MIL/uL   Hemoglobin 12.4  12.0 - 15.0 g/dL   HCT 16.1 (*) 09.6 - 04.5 %   MCV 87.7  78.0 - 100.0 fL   MCH 31.2  26.0 - 34.0 pg   MCHC 35.6  30.0 - 36.0 g/dL   RDW 40.9  81.1 - 91.4 %   Platelets 178  150 - 400 K/uL   Neutrophils Relative 80 (*) 43 - 77 %   Neutro Abs 4.0  1.7 - 7.7 K/uL   Lymphocytes Relative 10 (*) 12 - 46 %   Lymphs Abs 0.5 (*) 0.7 - 4.0 K/uL   Monocytes Relative 10  3 - 12 %   Monocytes Absolute 0.5  0.1 - 1.0 K/uL   Eosinophils Relative 0  0 - 5 %   Eosinophils Absolute 0.0  0.0 - 0.7 K/uL   Basophils Relative 0  0 - 1 %   Basophils Absolute 0.0  0.0 - 0.1 K/uL  BASIC METABOLIC PANEL      Component Value Range   Sodium 136  135 - 145 mEq/L   Potassium 3.1 (*) 3.5 - 5.1 mEq/L   Chloride 102  96 - 112 mEq/L   CO2 21  19 - 32 mEq/L   Glucose, Bld 94  70 - 99 mg/dL   BUN 7  6 - 23 mg/dL   Creatinine, Ser 7.82  0.50 - 1.10 mg/dL   Calcium 9.3  8.4 - 95.6 mg/dL   GFR calc non Af Amer >90  >90 mL/min   GFR calc Af Amer >90  >90 mL/min  URINALYSIS, ROUTINE W REFLEX MICROSCOPIC      Component Value Range   Color, Urine YELLOW  YELLOW   APPearance CLOUDY (*) CLEAR   Specific Gravity, Urine 1.029  1.005 - 1.030   pH 6.0  5.0 - 8.0   Glucose, UA NEGATIVE  NEGATIVE mg/dL   Hgb urine  dipstick NEGATIVE  NEGATIVE   Bilirubin Urine NEGATIVE  NEGATIVE   Ketones, ur >80 (*) NEGATIVE mg/dL   Protein, ur 30 (*) NEGATIVE mg/dL   Urobilinogen, UA 0.2  0.0 - 1.0 mg/dL   Nitrite NEGATIVE  NEGATIVE   Leukocytes, UA NEGATIVE  NEGATIVE  LIPASE, BLOOD      Component Value Range   Lipase 23  11 - 59 U/L  OCCULT BLOOD, POC DEVICE      Component Value Range   Fecal Occult Bld NEGATIVE    URINE MICROSCOPIC-ADD ON      Component Value Range   Squamous Epithelial / LPF FEW (*) RARE   Bacteria, UA FEW (*) RARE   Urine-Other MUCOUS PRESENT      Ct Abdomen Pelvis W Contrast  04/16/2012  *RADIOLOGY REPORT*  Clinical Data: Abdominal pain with nausea and vomiting.  CT ABDOMEN AND PELVIS WITH CONTRAST  Technique:  Multidetector CT imaging of the abdomen and pelvis was performed following the standard protocol during bolus administration of intravenous contrast.  Contrast: OMNIPAQUE IOHEXOL 300 MG/ML  SOLN  Comparison: CT 12/02/2010  Findings: 5.4 cm cyst projects into the left lung base from the posterior mediastinum.  This is lateral to the aorta and esophagus. 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  This was present previously but was not completely scanned on the prior study.  This is most likely an incidental finding.  Liver gallbladder and spleen are normal.  Pancreas and kidneys are normal.  Negative for bowel obstruction or bowel thickening.  The appendix has been removed.  3 cm corpus luteum cyst on the right with moderate free fluid in the pelvis.  The uterus is normal.  Left ovary is normal.  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 Bronchogenic cyst also considered.  Negative for bowel obstruction or bowel thickening.  Corpus luteum cyst on the right with free fluid in the pelvis.   Original Report Authenticated By: Camelia Phenes, M.D.      1.  Abdominal pain, chronic, right upper quadrant       MDM  Paitient presented yesterday with complain of RUQ pain with associate NVD. CT abdomen pelvis unremarkable at that time. Pain rated 10/10. Patient given dilaudid, metroclopramide, benadryl and GI cocktail with relief. Patient also given fluids due to anorexia. CBC, Hepatic function & Lipase: unremarkable. BMP: hypokalemia patient given replacement in ED. UA: significants for ketonuria consistent with anorexia. Hemoccult: negative. Patient will be discharged on pain medication and Zofran and encouraged again, as in last assessment, to follow up with GI. Return precautions given verbally and in discharge summary. No red flags for cholecystitis, pancreatitis, or other more serious cause of acute abdomen.         Pixie Casino, PA-C 04/16/12 [phone]

## 2012-04-16 NOTE — ED Notes (Signed)
Patient is alert and oriented x3.  She was given DC instructions and follow up visit instructions.  Patient gave verbal understanding. She was DC ambulatory under his own power to home.  V/S stable.  He was not showing any signs of distress on DC 

## 2012-04-16 NOTE — ED Notes (Signed)
PA at bedside.

## 2012-04-16 NOTE — ED Notes (Signed)
OZH:[id number]<HQ> Expected date:<BR> Expected time:<BR> Means of arrival:<BR> Comments:<BR> abd pain

## 2012-04-16 NOTE — ED Notes (Signed)
EMS called to home.  Found patient sitting in house with a complaint of abdominal pain. She rates her pain 10 of 10 constant.  She confirms nausea, vomiting and diarrhea.   She states that she has dark and tarry stools.  She was able to ambulate to ambulance.

## 2012-04-16 NOTE — ED Notes (Signed)
Unable to locate PA to update on pt status. Tried calling and phone went straight to Estate agent. Check office as well. Well continue to monitor pt.

## 2012-04-17 ENCOUNTER — Encounter (HOSPITAL_COMMUNITY): Payer: Self-pay | Admitting: Emergency Medicine

## 2012-04-17 ENCOUNTER — Emergency Department (HOSPITAL_COMMUNITY)
Admission: EM | Admit: 2012-04-17 | Discharge: 2012-04-17 | Disposition: A | Discharge status: Home / Self Care | Payer: Self-pay | Attending: Emergency Medicine | Admitting: Emergency Medicine

## 2012-04-17 DIAGNOSIS — R222 Localized swelling, mass and lump, trunk: Secondary | ICD-10-CM | POA: Insufficient documentation

## 2012-04-17 DIAGNOSIS — K7689 Other specified diseases of liver: Secondary | ICD-10-CM | POA: Insufficient documentation

## 2012-04-17 DIAGNOSIS — R109 Unspecified abdominal pain: Secondary | ICD-10-CM | POA: Insufficient documentation

## 2012-04-17 DIAGNOSIS — F17200 Nicotine dependence, unspecified, uncomplicated: Secondary | ICD-10-CM | POA: Insufficient documentation

## 2012-04-17 DIAGNOSIS — N831 Corpus luteum cyst of ovary, unspecified side: Secondary | ICD-10-CM | POA: Insufficient documentation

## 2012-04-17 DIAGNOSIS — R112 Nausea with vomiting, unspecified: Secondary | ICD-10-CM | POA: Insufficient documentation

## 2012-04-17 DIAGNOSIS — G8929 Other chronic pain: Secondary | ICD-10-CM | POA: Insufficient documentation

## 2012-04-17 DIAGNOSIS — R1110 Vomiting, unspecified: Secondary | ICD-10-CM

## 2012-04-17 LAB — POCT I-STAT, CHEM 8
Glucose, Bld: 108 mg/dL — ABNORMAL HIGH (ref 70–99)
HCT: 36 % (ref 36.0–46.0)
Hemoglobin: 12.2 g/dL (ref 12.0–15.0)
Potassium: 3.5 mEq/L (ref 3.5–5.1)
Sodium: 141 mEq/L (ref 135–145)

## 2012-04-17 MED ORDER — ONDANSETRON HCL 4 MG/2ML IJ SOLN
4.0000 mg | Freq: Once | INTRAMUSCULAR | Status: AC
  Administered 2012-04-17: 4 mg via INTRAVENOUS
  Filled 2012-04-17: qty 2

## 2012-04-17 MED ORDER — HYOSCYAMINE SULFATE 0.5 MG/ML IJ SOLN
0.1250 mg | INTRAMUSCULAR | Status: AC
  Administered 2012-04-17: 0.125 mg via INTRAVENOUS
  Filled 2012-04-17: qty 0.25

## 2012-04-17 MED ORDER — SODIUM CHLORIDE 0.9 % IV SOLN
INTRAVENOUS | Status: DC
  Administered 2012-04-17: 19:00:00 via INTRAVENOUS

## 2012-04-17 MED ORDER — PROMETHAZINE HCL 25 MG/ML IJ SOLN
12.5000 mg | Freq: Once | INTRAMUSCULAR | Status: AC
  Administered 2012-04-17: 12.5 mg via INTRAVENOUS
  Filled 2012-04-17: qty 1

## 2012-04-17 MED ORDER — SODIUM CHLORIDE 0.9 % IV BOLUS (SEPSIS)
1000.0000 mL | Freq: Once | INTRAVENOUS | Status: AC
  Administered 2012-04-17: 1000 mL via INTRAVENOUS

## 2012-04-17 MED ORDER — PROMETHAZINE HCL 12.5 MG RE SUPP: 12.5000 mg | Freq: Four times a day (QID) | RECTAL | Status: DC | PRN

## 2012-04-17 NOTE — ED Provider Notes (Signed)
History     CSN: [id number]  Arrival date & time 04/17/12  1322   First MD Initiated Contact with Patient 04/17/12 1427      Chief Complaint  Patient presents with  . Emesis    (Consider location/radiation/quality/duration/timing/severity/associated sxs/prior treatment) Patient is a 21 y.o. female presenting with vomiting. The history is provided by the patient and a parent.  Emesis  This is a chronic problem. The emesis has an appearance of stomach contents. There has been no fever. Associated symptoms include abdominal pain and a fever. Associated symptoms comments: Patient returns for 3rd visit in 3 days for persistent/recurrent abdominal pain, nausea and vomiting. She reports some degree of abdominal pain since last year when her appendix was removed. She has PO medication for nausea at home and pain medication but, per mom, she is unable to tolerate. Marland Kitchen    History reviewed. No pertinent past medical history.  Past Surgical History  Procedure Date  . Appendectomy     Family History  Problem Relation Age of Onset  . Hypertension Mother     History  Substance Use Topics  . Smoking status: Current Some Day Smoker    Types: Cigarettes  . Smokeless tobacco: Not on file  . Alcohol Use: Yes    OB History    Grav Para Term Preterm Abortions TAB SAB Ect Mult Living                  Review of Systems  Constitutional: Positive for fever.  Respiratory: Negative for shortness of breath.   Cardiovascular: Negative for chest pain.  Gastrointestinal: Positive for nausea, vomiting and abdominal pain.  Genitourinary: Negative for dysuria.  Musculoskeletal: Negative for back pain.    Allergies  Review of patient's allergies indicates no known allergies.  Home Medications   Current Outpatient Rx  Name Route Sig Dispense Refill  . ACETAMINOPHEN 500 MG PO TABS Oral Take 1,000 mg by mouth every 6 (six) hours as needed. For pain    . HYDROCODONE-ACETAMINOPHEN 5-500 MG PO  TABS Oral Take 1-2 tablets by mouth every 6 (six) hours as needed. For pain.    Marland Kitchen OMEPRAZOLE 20 MG PO CPDR Oral Take 20 mg by mouth daily.    Marland Kitchen ONDANSETRON HCL 4 MG PO TABS Oral Take 4 mg by mouth every 6 (six) hours as needed. For nausea.      BP 100/60  Pulse 86  Resp 22  SpO2 98%  LMP 03/26/2012  Physical Exam  Constitutional: She appears well-developed and well-nourished. No distress.  HENT:  Head: Normocephalic.  Mouth/Throat: Oropharynx is clear and moist.  Neck: Normal range of motion.  Cardiovascular: Normal rate.   No murmur heard. Pulmonary/Chest: Effort normal. She has no wheezes. She has no rales.  Abdominal: Soft. Bowel sounds are normal.       Diffuse tenderness to soft abdomen. No masses.   Musculoskeletal: Normal range of motion.  Skin: Skin is warm and dry.  Psychiatric:       Patient lies on stretcher, nonverbal with the exception of occasional moaning, mom does the talking.     ED Course  Procedures (including critical care time)  Labs Reviewed - No data to display Ct Abdomen Pelvis W Contrast  04/16/2012  *RADIOLOGY REPORT*  Clinical Data: Abdominal pain with nausea and vomiting.  CT ABDOMEN AND PELVIS WITH CONTRAST  Technique:  Multidetector CT imaging of the abdomen and pelvis was performed following the standard protocol during bolus administration of intravenous  contrast.  Contrast: OMNIPAQUE IOHEXOL 300 MG/ML  SOLN  Comparison: CT 12/02/2010  Findings: 5.4 cm cyst projects into the left lung base from the posterior mediastinum.  This is lateral to the aorta and esophagus. 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  This was present previously but was not completely scanned on the prior study.  This is most likely an incidental finding.  Liver gallbladder and spleen are normal.  Pancreas and kidneys are normal.  Negative for bowel obstruction or bowel thickening.  The appendix has been removed.  3 cm corpus luteum  cyst on the right with moderate free fluid in the pelvis.  The uterus is normal.  Left ovary is normal.  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 Bronchogenic cyst also considered.  Negative for bowel obstruction or bowel thickening.  Corpus luteum cyst on the right with free fluid in the pelvis.   Original Report Authenticated By: Camelia Phenes, M.D.      No diagnosis found.  1. Chronic abdominal pain 2. N, V  MDM  Records reviewed: Patient discharged home three times in last three days noted to be improved, tolerating PO's. CT abd/pel, ultrasound, blood studies all negative. She is reported to have GI appointment this Friday. Mom and I discussed planned treatment for today while in the room with patient as stopping all IV narcotic medications; will treat with GI antispasmodics, antiemetics, rehydration, check electrolytes and anticipate discharge home and GI follow up as scheduled.        Rodena Medin, PA-C 04/17/12 1530

## 2012-04-17 NOTE — ED Notes (Signed)
PA at bedside.

## 2012-04-17 NOTE — ED Notes (Signed)
Emesis since last two days; abd. Pain much worse from vomiting,

## 2012-04-17 NOTE — ED Notes (Addendum)
Pt c/o lower abd pain and n/v/d X 4 days. Pt reports she went to [facility] on thurs and fri, they dx her with a cyst on her ovary and instructed her to go to her follow-up appt, pt reports she did not go to her follow-up appt. Pt reports she was given pain medicine from [facility] but hasn't been able to keep them down. LMP Aug. 2.

## 2012-04-17 NOTE — ED Provider Notes (Signed)
Medical screening examination/treatment/procedure(s) were performed by non-physician practitioner and as supervising physician I was immediately available for consultation/collaboration.  Randa Riss, MD 04/17/12 1558 

## 2012-04-17 NOTE — ED Provider Notes (Signed)
Assumed pt care in CDU.  21 year old female presents to ER for the 4th times in the past week for c/o chronic abd pain with associated n/v.  She has had a full work up.  No acute findings.  Pt is schedule to f/u with GI on Monday.  Plan to control nausea with Phenergan, no IV narcotic.  Will d/c with Phenergan suppository once pt able to tolerates PO.   6:19 PM Pt with persistent dry heave without active vomit.  VSS, normal electrolytes.  Will continue to monitor and will PO trial when appropriate.  Discussed plan with pt and family member.    9:37 PM No significant changes in pt's status.  Continues to dry heave. Only produce 100cc of water vomit in 6 hrs.  Pt reports she has had abd pain for 1 year since removal of her appendix.  I do not think pt will benefit from further stay in ED.  Will d/c with phenergan suppository.  Pt to f/u with GI specialist as previously scheduled.  Pt did tolerates PO  Results for orders placed during the hospital encounter of 04/17/12  POCT I-STAT, CHEM 8      Component Value Range   Sodium 141  135 - 145 mEq/L   Potassium 3.5  3.5 - 5.1 mEq/L   Chloride 108  96 - 112 mEq/L   BUN 6  6 - 23 mg/dL   Creatinine, Ser 1.91  0.50 - 1.10 mg/dL   Glucose, Bld 478 (*) 70 - 99 mg/dL   Calcium, Ion 2.95  6.21 - 1.23 mmol/L   TCO2 18  0 - 100 mmol/L   Hemoglobin 12.2  12.0 - 15.0 g/dL   HCT 30.8  65.7 - 84.6 %   US Abdomen Complete  03/25/2012  *RADIOLOGY REPORT*  Clinical Data:  Upper abdominal pain, prior appendectomy  ABDOMINAL ULTRASOUND COMPLETE  Comparison:  12/02/2010 CT without contrast  Findings:  Gallbladder:  No gallstones, gallbladder wall thickening, or pericholecystic fluid.  Common Bile Duct:  Within normal limits in caliber.  Liver: Diffuse increased echogenicity compatible with mild hepatic steatosis.  No focal abnormality or biliary dilatation.  IVC:  Appears normal.  Pancreas:  No abnormality identified.  Spleen:  Within normal limits in size and  echotexture.  Right kidney:  Normal in size and parenchymal echogenicity.  No evidence of mass or hydronephrosis.  Left kidney:  Normal in size and parenchymal echogenicity.  No evidence of mass or hydronephrosis.  Abdominal Aorta:  No aneurysm identified.  IMPRESSION: Mild hepatic steatosis or fatty infiltration.  No acute finding by ultrasound.  Original Report Authenticated By: Judie Petit. Ruel Favors, M.D.   Ct Abdomen Pelvis W Contrast  04/16/2012  *RADIOLOGY REPORT*  Clinical Data: Abdominal pain with nausea and vomiting.  CT ABDOMEN AND PELVIS WITH CONTRAST  Technique:  Multidetector CT imaging of the abdomen and pelvis was performed following the standard protocol during bolus administration of intravenous contrast.  Contrast: OMNIPAQUE IOHEXOL 300 MG/ML  SOLN  Comparison: CT 12/02/2010  Findings: 5.4 cm cyst projects into the left lung base from the posterior mediastinum.  This is lateral to the aorta and esophagus. 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  This was present previously but was not completely scanned on the prior study.  This is most likely an incidental finding.  Liver gallbladder and spleen are normal.  Pancreas and kidneys are normal.  Negative for bowel obstruction or bowel thickening.  The appendix has been removed.  3 cm corpus luteum cyst on the right with moderate free fluid in the pelvis.  The uterus is normal.  Left ovary is normal.  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 Bronchogenic cyst also considered.  Negative for bowel obstruction or bowel thickening.  Corpus luteum cyst on the right with free fluid in the pelvis.   Original Report Authenticated By: Camelia Phenes, M.D.     BP 134/86  Pulse 97  Resp 22  SpO2 99%  LMP 03/26/2012   Fayrene Helper, PA-C 04/17/12 2148

## 2012-04-17 NOTE — ED Provider Notes (Signed)
Medical screening examination/treatment/procedure(s) were performed by non-physician practitioner and as supervising physician I was immediately available for consultation/collaboration.   Marci Polito, MD 04/17/12 2304 

## 2012-04-18 NOTE — ED Provider Notes (Signed)
Medical screening examination/treatment/procedure(s) were performed by non-physician practitioner and as supervising physician I was immediately available for consultation/collaboration.  Cheri Guppy, MD 04/18/12 [phone]

## 2012-10-08 ENCOUNTER — Emergency Department (HOSPITAL_COMMUNITY): Payer: Self-pay

## 2012-10-08 ENCOUNTER — Emergency Department (HOSPITAL_COMMUNITY)
Admission: EM | Admit: 2012-10-08 | Discharge: 2012-10-08 | Disposition: A | Discharge status: Home / Self Care | Payer: Self-pay | Attending: Emergency Medicine | Admitting: Emergency Medicine

## 2012-10-08 DIAGNOSIS — R079 Chest pain, unspecified: Secondary | ICD-10-CM | POA: Insufficient documentation

## 2012-10-08 DIAGNOSIS — S60229A Contusion of unspecified hand, initial encounter: Secondary | ICD-10-CM | POA: Insufficient documentation

## 2012-10-08 DIAGNOSIS — R51 Headache: Secondary | ICD-10-CM | POA: Insufficient documentation

## 2012-10-08 DIAGNOSIS — IMO0002 Reserved for concepts with insufficient information to code with codable children: Secondary | ICD-10-CM | POA: Insufficient documentation

## 2012-10-08 DIAGNOSIS — Z9089 Acquired absence of other organs: Secondary | ICD-10-CM | POA: Insufficient documentation

## 2012-10-08 DIAGNOSIS — R1084 Generalized abdominal pain: Secondary | ICD-10-CM | POA: Insufficient documentation

## 2012-10-08 DIAGNOSIS — Y9289 Other specified places as the place of occurrence of the external cause: Secondary | ICD-10-CM | POA: Insufficient documentation

## 2012-10-08 DIAGNOSIS — R000 Tachycardia, unspecified: Secondary | ICD-10-CM | POA: Insufficient documentation

## 2012-10-08 DIAGNOSIS — Z79899 Other long term (current) drug therapy: Secondary | ICD-10-CM | POA: Insufficient documentation

## 2012-10-08 DIAGNOSIS — Y9389 Activity, other specified: Secondary | ICD-10-CM | POA: Insufficient documentation

## 2012-10-08 DIAGNOSIS — F17200 Nicotine dependence, unspecified, uncomplicated: Secondary | ICD-10-CM | POA: Insufficient documentation

## 2012-10-08 DIAGNOSIS — G8929 Other chronic pain: Secondary | ICD-10-CM | POA: Insufficient documentation

## 2012-10-08 DIAGNOSIS — N390 Urinary tract infection, site not specified: Secondary | ICD-10-CM | POA: Insufficient documentation

## 2012-10-08 LAB — CBC WITH DIFFERENTIAL/PLATELET
Basophils Relative: 0 % (ref 0–1)
Eosinophils Absolute: 0 10*3/uL (ref 0.0–0.7)
HCT: 38.8 % (ref 36.0–46.0)
Hemoglobin: 14.2 g/dL (ref 12.0–15.0)
MCH: 32.9 pg (ref 26.0–34.0)
MCHC: 36.6 g/dL — ABNORMAL HIGH (ref 30.0–36.0)
MCV: 89.8 fL (ref 78.0–100.0)
Monocytes Absolute: 0.6 10*3/uL (ref 0.1–1.0)
Monocytes Relative: 12 % (ref 3–12)

## 2012-10-08 LAB — URINALYSIS, ROUTINE W REFLEX MICROSCOPIC
Ketones, ur: >80 mg/dL — AB
Nitrite: NEGATIVE
Specific Gravity, Urine: 1.023 (ref 1.005–1.030)
Urobilinogen, UA: 1 mg/dL (ref 0.0–1.0)
pH: 5.5 (ref 5.0–8.0)

## 2012-10-08 LAB — COMPREHENSIVE METABOLIC PANEL
Alkaline Phosphatase: 46 U/L (ref 39–117)
BUN: 15 mg/dL (ref 6–23)
CO2: 21 mEq/L (ref 19–32)
Calcium: 9.6 mg/dL (ref 8.4–10.5)
GFR calc Af Amer: >90 mL/min (ref >90)
GFR calc non Af Amer: >90 mL/min (ref >90)
Glucose, Bld: 83 mg/dL (ref 70–99)
Total Protein: 8.4 g/dL — ABNORMAL HIGH (ref 6.0–8.3)

## 2012-10-08 LAB — URINE MICROSCOPIC-ADD ON

## 2012-10-08 MED ORDER — DEXAMETHASONE SODIUM PHOSPHATE 10 MG/ML IJ SOLN
10.0000 mg | Freq: Once | INTRAMUSCULAR | Status: AC
  Administered 2012-10-08: 10 mg via INTRAVENOUS
  Filled 2012-10-08: qty 1

## 2012-10-08 MED ORDER — DIPHENHYDRAMINE HCL 50 MG/ML IJ SOLN
12.5000 mg | Freq: Once | INTRAMUSCULAR | Status: AC
  Administered 2012-10-08: 12.5 mg via INTRAVENOUS
  Filled 2012-10-08: qty 1

## 2012-10-08 MED ORDER — METOCLOPRAMIDE HCL 5 MG/ML IJ SOLN
10.0000 mg | Freq: Once | INTRAMUSCULAR | Status: AC
  Administered 2012-10-08: 10 mg via INTRAVENOUS
  Filled 2012-10-08: qty 2

## 2012-10-08 MED ORDER — SODIUM CHLORIDE 0.9 % IV BOLUS (SEPSIS)
1000.0000 mL | Freq: Once | INTRAVENOUS | Status: AC
  Administered 2012-10-08: 1000 mL via INTRAVENOUS

## 2012-10-08 MED ORDER — CEPHALEXIN 500 MG PO CAPS: 500.0000 mg | ORAL_CAPSULE | Freq: Four times a day (QID) | ORAL | Status: DC

## 2012-10-08 NOTE — ED Notes (Signed)
Per Patient:  C/O vomiting which began 09/30/12 (2-3 episodes/ day) and last episode occurred last night around 10AM.  Chest pressure constant since pancreatitis dx back in Sept 2013.  Chest pain began yesterday associated with dizziness, vomiting and weakness.  Pt's right hand is swollen due to punching the dashboard yesterday (has been icing it).  Pt states that she has a cyst around area of heart.
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
## 2012-10-09 LAB — URINE CULTURE

## 2012-10-09 NOTE — ED Provider Notes (Signed)
Medical screening examination/treatment/procedure(s) were performed by non-physician practitioner and as supervising physician I was immediately available for consultation/collaboration.  Tsuneo Faison R. Mariajose Mow, MD 10/09/12 1539 

## 2015-03-20 ENCOUNTER — Emergency Department (HOSPITAL_BASED_OUTPATIENT_CLINIC_OR_DEPARTMENT_OTHER): Payer: Commercial Managed Care - HMO

## 2015-03-20 ENCOUNTER — Emergency Department (HOSPITAL_BASED_OUTPATIENT_CLINIC_OR_DEPARTMENT_OTHER)
Admission: EM | Admit: 2015-03-20 | Discharge: 2015-03-20 | Disposition: A | Discharge status: Home / Self Care | Payer: Commercial Managed Care - HMO | Attending: Emergency Medicine | Admitting: Emergency Medicine

## 2015-03-20 ENCOUNTER — Encounter (HOSPITAL_BASED_OUTPATIENT_CLINIC_OR_DEPARTMENT_OTHER): Payer: Self-pay

## 2015-03-20 ENCOUNTER — Other Ambulatory Visit: Payer: Self-pay

## 2015-03-20 DIAGNOSIS — R112 Nausea with vomiting, unspecified: Secondary | ICD-10-CM

## 2015-03-20 DIAGNOSIS — R079 Chest pain, unspecified: Secondary | ICD-10-CM | POA: Diagnosis present

## 2015-03-20 DIAGNOSIS — Z8679 Personal history of other diseases of the circulatory system: Secondary | ICD-10-CM | POA: Diagnosis not present

## 2015-03-20 DIAGNOSIS — R0789 Other chest pain: Secondary | ICD-10-CM | POA: Diagnosis not present

## 2015-03-20 DIAGNOSIS — Z3202 Encounter for pregnancy test, result negative: Secondary | ICD-10-CM | POA: Diagnosis not present

## 2015-03-20 DIAGNOSIS — Z87891 Personal history of nicotine dependence: Secondary | ICD-10-CM | POA: Diagnosis not present

## 2015-03-20 DIAGNOSIS — Z8719 Personal history of other diseases of the digestive system: Secondary | ICD-10-CM | POA: Insufficient documentation

## 2015-03-20 DIAGNOSIS — R000 Tachycardia, unspecified: Secondary | ICD-10-CM | POA: Insufficient documentation

## 2015-03-20 DIAGNOSIS — E860 Dehydration: Secondary | ICD-10-CM

## 2015-03-20 HISTORY — DX: Obstruction of bile duct: K83.1

## 2015-03-20 HISTORY — DX: Acute pancreatitis without necrosis or infection, unspecified: K85.90

## 2015-03-20 HISTORY — DX: Other ill-defined heart diseases: I51.89

## 2015-03-20 LAB — URINE MICROSCOPIC-ADD ON

## 2015-03-20 LAB — BASIC METABOLIC PANEL
ANION GAP: 12 (ref 5–15)
BUN: 16 mg/dL (ref 6–20)
CO2: 20 mmol/L — AB (ref 22–32)
Calcium: 10 mg/dL (ref 8.9–10.3)
Chloride: 105 mmol/L (ref 101–111)
Creatinine, Ser: 0.98 mg/dL (ref 0.44–1.00)
GFR calc Af Amer: >60 mL/min (ref >60)
GLUCOSE: 108 mg/dL — AB (ref 65–99)
Potassium: 3.7 mmol/L (ref 3.5–5.1)
Sodium: 137 mmol/L (ref 135–145)

## 2015-03-20 LAB — CBC WITH DIFFERENTIAL/PLATELET
BASOS PCT: 0 % (ref 0–1)
Basophils Absolute: 0 10*3/uL (ref 0.0–0.1)
Eosinophils Absolute: 0 10*3/uL (ref 0.0–0.7)
Eosinophils Relative: 0 % (ref 0–5)
HCT: 39.7 % (ref 36.0–46.0)
Hemoglobin: 14.5 g/dL (ref 12.0–15.0)
LYMPHS ABS: 0.5 10*3/uL — AB (ref 0.7–4.0)
Lymphocytes Relative: 12 % (ref 12–46)
MCH: 32.2 pg (ref 26.0–34.0)
MCHC: 36.5 g/dL — AB (ref 30.0–36.0)
MCV: 88.2 fL (ref 78.0–100.0)
MONO ABS: 0.5 10*3/uL (ref 0.1–1.0)
MONOS PCT: 10 % (ref 3–12)
NEUTROS ABS: 3.5 10*3/uL (ref 1.7–7.7)
Neutrophils Relative %: 78 % — ABNORMAL HIGH (ref 43–77)
PLATELETS: 208 10*3/uL (ref 150–400)
RBC: 4.5 MIL/uL (ref 3.87–5.11)
RDW: 11.8 % (ref 11.5–15.5)
WBC: 4.5 10*3/uL (ref 4.0–10.5)

## 2015-03-20 LAB — COMPREHENSIVE METABOLIC PANEL
ALK PHOS: 41 U/L (ref 38–126)
ALT: 25 U/L (ref 14–54)
ANION GAP: 11 (ref 5–15)
AST: 23 U/L (ref 15–41)
Albumin: 5.1 g/dL — ABNORMAL HIGH (ref 3.5–5.0)
BILIRUBIN TOTAL: 2.2 mg/dL — AB (ref 0.3–1.2)
BUN: 16 mg/dL (ref 6–20)
CALCIUM: 10 mg/dL (ref 8.9–10.3)
CO2: 19 mmol/L — ABNORMAL LOW (ref 22–32)
Chloride: 106 mmol/L (ref 101–111)
Creatinine, Ser: 0.96 mg/dL (ref 0.44–1.00)
GFR calc Af Amer: >60 mL/min (ref >60)
GLUCOSE: 108 mg/dL — AB (ref 65–99)
POTASSIUM: 3.6 mmol/L (ref 3.5–5.1)
Sodium: 136 mmol/L (ref 135–145)
TOTAL PROTEIN: 8.4 g/dL — AB (ref 6.5–8.1)

## 2015-03-20 LAB — URINALYSIS, ROUTINE W REFLEX MICROSCOPIC
GLUCOSE, UA: NEGATIVE mg/dL
Hgb urine dipstick: NEGATIVE
Ketones, ur: >80 mg/dL — AB
Nitrite: NEGATIVE
Protein, ur: 30 mg/dL — AB
Specific Gravity, Urine: 1.019 (ref 1.005–1.030)
Urobilinogen, UA: 1 mg/dL (ref 0.0–1.0)
pH: 5.5 (ref 5.0–8.0)

## 2015-03-20 LAB — I-STAT CG4 LACTIC ACID, ED: LACTIC ACID, VENOUS: 1.88 mmol/L (ref 0.5–2.0)

## 2015-03-20 LAB — PREGNANCY, URINE: PREG TEST UR: NEGATIVE

## 2015-03-20 LAB — LIPASE, BLOOD: LIPASE: 19 U/L — AB (ref 22–51)

## 2015-03-20 LAB — TROPONIN I: Troponin I: <0.03 ng/mL (ref <0.031)

## 2015-03-20 MED ORDER — IOHEXOL 300 MG/ML  SOLN
100.0000 mL | Freq: Once | INTRAMUSCULAR | Status: AC | PRN
  Administered 2015-03-20: 100 mL via INTRAVENOUS

## 2015-03-20 MED ORDER — GI COCKTAIL ~~LOC~~
30.0000 mL | Freq: Once | ORAL | Status: AC
  Administered 2015-03-20: 30 mL via ORAL
  Filled 2015-03-20: qty 30

## 2015-03-20 MED ORDER — PROMETHAZINE HCL 25 MG/ML IJ SOLN
INTRAMUSCULAR | Status: AC
  Filled 2015-03-20: qty 1

## 2015-03-20 MED ORDER — SODIUM CHLORIDE 0.9 % IV BOLUS (SEPSIS)
1000.0000 mL | Freq: Once | INTRAVENOUS | Status: AC
  Administered 2015-03-20: 1000 mL via INTRAVENOUS

## 2015-03-20 MED ORDER — PROMETHAZINE HCL 25 MG/ML IJ SOLN
12.5000 mg | Freq: Once | INTRAMUSCULAR | Status: AC
  Administered 2015-03-20: 12.5 mg via INTRAVENOUS

## 2015-03-20 MED ORDER — PROMETHAZINE HCL 12.5 MG RE SUPP: 12.5000 mg | Freq: Four times a day (QID) | RECTAL | Status: DC | PRN

## 2015-03-20 NOTE — ED Notes (Signed)
amb to BR w/o difficulty 

## 2015-03-20 NOTE — ED Provider Notes (Signed)
CSN: [id number]     Arrival date & time 03/20/15  1223 History   First MD Initiated Contact with Patient 03/20/15 1236     Chief Complaint  Patient presents with  . Chest Pain     (Consider location/radiation/quality/duration/timing/severity/associated sxs/prior Treatment) HPI Comments: Pt. 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 She says that she has been having nausea and vomiting over the past several weeks during which time she has been able to keep very little down. She says that she got into an argument with her brother this am, and she got very upset. She says that she began to have pain in the bottom of her chest that was radiating to her back. She says that this pain is different from the pain that she has chronically related to her pancreatitis. She says it concerned her so she called EMS to bring her to the ED. She says that she used to smoke marijuana daily, and that she recently quit about 3 weeks ago. She continues to have baseline nausea. 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 She was given the option of removal of this mass via cardiac surgery but she declined and has not had cardiac follow up since. She otherwise has had no diaphoresis. She does have neck pain and shoulder pain on the right. She says she does not feel like she has the pain associated with her pancreatitis at this time. She denies vision changes, headaches. Her pain is not worse with exertion, and nothing makes it better. It is constant and radiates to her back. She has no chest tightness.   The history is provided by the patient.    Past Medical History  Diagnosis Date  . Mass of heart   . 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 Appendectomy      Family History  Problem Relation Age of Onset  . Hypertension Mother    History  Substance Use Topics  . Smoking status: Former Games developer  . Smokeless tobacco: Not on file  . Alcohol Use: No   OB History    No data available     Review of Systems  Constitutional: Positive for activity change and appetite change. Negative for fever, chills, diaphoresis, fatigue and unexpected weight change.  HENT: Negative.  Negative for congestion, facial swelling, rhinorrhea, sore throat and trouble swallowing.   Eyes: Negative.  Negative for photophobia, pain and redness.  Respiratory: Negative for cough, choking, chest tightness, shortness of breath and wheezing.   Cardiovascular: Positive for chest pain. Negative for palpitations and leg swelling.  Gastrointestinal: Positive for nausea and vomiting. Negative for abdominal pain, diarrhea, constipation and abdominal distention.  Endocrine: Negative.  Negative for polyuria.  Genitourinary: Negative for dysuria, urgency and frequency.  Musculoskeletal: Positive for back pain and neck pain. Negative for joint swelling, arthralgias and neck stiffness.  Skin: Negative for pallor and rash.  Allergic/Immunologic: Negative.   Neurological: Negative.  Negative for tremors, seizures, syncope, weakness, light-headedness and headaches.  Hematological: Negative.   Psychiatric/Behavioral: Negative.       Allergies  Codeine  Home Medications   Prior to Admission medications   Medication Sig Start Date End Date Taking? Authorizing Provider  promethazine (PHENERGAN) 12.5  MG suppository Place 1 suppository (12.5 mg total) rectally every 6 (six) hours as needed for nausea or vomiting. 03/20/15   Hillery Hunter Lorijean Husser, MD   BP 134/95 mmHg  Pulse 109  Temp(Src) 98.2 F (36.8 C) (Oral)  Resp 19  Ht 5\' 9"  (1.753 m)  Wt 170 lb (77.111 kg)  BMI 25.09 kg/m2  SpO2 100%  LMP 02/27/2015 Physical Exam  Constitutional: She is oriented to person, place, and time.  She appears well-developed and well-nourished. No distress.  HENT:  Head: Normocephalic and atraumatic.  Mouth/Throat: No oropharyngeal exudate.  Eyes: Conjunctivae and EOM are normal. Pupils are equal, round, and reactive to light.  Neck: Normal range of motion. Neck supple. No JVD present. No tracheal deviation present. No thyromegaly present.  Cardiovascular: Regular rhythm, S1 normal, S2 normal, normal heart sounds and intact distal pulses.  Tachycardia present.  PMI is not displaced.  Exam reveals no gallop and no friction rub.   No murmur heard. Pulmonary/Chest: Effort normal and breath sounds normal. No respiratory distress. She has no wheezes. She has no rales. She exhibits no tenderness.  Abdominal: Soft. Bowel sounds are normal. She exhibits no distension and no mass. There is no tenderness. There is no rebound and no guarding.  Musculoskeletal: Normal range of motion. She exhibits no edema or tenderness.  Lymphadenopathy:    She has no cervical adenopathy.  Neurological: She is alert and oriented to person, place, and time. She exhibits normal muscle tone. Coordination normal.  Skin: Skin is warm and dry. No rash noted. She is not diaphoretic. No erythema. No pallor.  Psychiatric: She has a normal mood and affect. Her behavior is normal.    ED Course  Procedures (including critical care time) Labs Review Labs Reviewed  BASIC METABOLIC PANEL - Abnormal; Notable for the following:    CO2 20 (*)    Glucose, Bld 108 (*)    All other components within normal limits  CBC WITH DIFFERENTIAL/PLATELET - Abnormal; Notable for the following:    MCHC 36.5 (*)    Neutrophils Relative % 78 (*)    Lymphs Abs 0.5 (*)    All other components within normal limits  LIPASE, BLOOD - Abnormal; Notable for the following:    Lipase 19 (*)    All other components within normal limits  COMPREHENSIVE METABOLIC PANEL - Abnormal; Notable for the following:    CO2 19 (*)    Glucose, Bld 108 (*)     Total Protein 8.4 (*)    Albumin 5.1 (*)    Total Bilirubin 2.2 (*)    All other components within normal limits  URINALYSIS, ROUTINE W REFLEX MICROSCOPIC (NOT AT [facility]) - Abnormal; Notable for the following:    Color, Urine AMBER (*)    APPearance TURBID (*)    Bilirubin Urine MODERATE (*)    Ketones, ur >80 (*)    Protein, ur 30 (*)    Leukocytes, UA SMALL (*)    All other components within normal limits  URINE MICROSCOPIC-ADD ON - Abnormal; Notable for the following:    Squamous Epithelial / LPF MANY (*)    Bacteria, UA MANY (*)    Casts GRANULAR CAST (*)    All other components within normal limits  TROPONIN I  PREGNANCY, URINE  I-STAT CG4 LACTIC ACID, ED    Imaging Review Dg Chest 2 View  03/20/2015   CLINICAL DATA:  Chest pain or shortness of breath and left arm pain beginning this morning. No  known injury. Initial encounter.  EXAM: CHEST  2 VIEW  COMPARISON:  PA and lateral chest 11/04/2013. CT abdomen and pelvis 04/16/2012.  FINDINGS: Retrocardiac density is again identified and appears unchanged. 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 Heart size is normal. The lungs are clear. There is no pneumothorax or pleural effusion.  IMPRESSION: No acute disease.   Electronically Signed   By: Drusilla Kanner M.D.   On: 03/20/2015 13:47   Ct Chest W Contrast  03/20/2015   CLINICAL DATA:  Chest pain that started this am after yelling/being upset. Also nausea, vomiting. Pt states always has this with pancreatitis  EXAM: CT CHEST, ABDOMEN, AND PELVIS WITH CONTRAST  TECHNIQUE: Multidetector CT imaging of the chest, abdomen and pelvis was performed following the standard protocol during bolus administration of intravenous contrast.  CONTRAST:  OMNIPAQUE IOHEXOL 300 MG/ML  SOLN  COMPARISON:  03/20/2015 radiograph, 04/16/2012 CT  FINDINGS: CT CHEST FINDINGS  Mediastinum/Nodes: No significant hilar or mediastinal adenopathy. Thoracic inlet is normal.There  is a fluid attenuation round lesion measuring 5 cm in diameter and medially in the right lower lobe. It is inseparable from the posterior left pericardium and the medial left pleural surface. It demonstrates average attenuation value of 51.  Lungs/Pleura: The lungs are clear. There is no evidence of pleural or pericardial effusion.  Musculoskeletal: Negative  CT ABDOMEN AND PELVIS FINDINGS  Hepatobiliary: Significant diffuse hepatic steatosis, more focally pronounced adjacent to the falciform ligament.  Pancreas: Normal  Spleen: Normal  Adrenals/Urinary Tract: Normal  Stomach/Bowel: Normal  Vascular/Lymphatic: Normal  Reproductive: The uterus is normal. There are bilateral adnexal cysts. There is a thin rimmed cysts in the left adnexa measuring 4.5 cm. There is a similar cyst in the right adnexa measuring 3.8 cm.  Other: Trace free fluid in the cul-de-sac.  Musculoskeletal: Negative  IMPRESSION: No acute abnormality in the thorax. 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  Hepatic steatosis.  Prominent bilateral cystic lesions in the pelvis likely representing ovarian cyst. Associated small volume free fluid. Suggest pelvic ultrasound better characterize.   Electronically Signed   By: Esperanza Heir M.D.   On: 03/20/2015 15:31   Ct Abdomen Pelvis W Contrast  03/20/2015   CLINICAL DATA:  Chest pain that started this am after yelling/being upset. Also nausea, vomiting. Pt states always has this with pancreatitis  EXAM: CT CHEST, ABDOMEN, AND PELVIS WITH CONTRAST  TECHNIQUE: Multidetector CT imaging of the chest, abdomen and pelvis was performed following the standard protocol during bolus administration of intravenous contrast.  CONTRAST:  OMNIPAQUE IOHEXOL 300 MG/ML  SOLN  COMPARISON:  03/20/2015 radiograph, 04/16/2012 CT  FINDINGS: CT CHEST FINDINGS  Mediastinum/Nodes: No significant hilar or mediastinal adenopathy. Thoracic inlet is normal.There is a fluid attenuation  round lesion measuring 5 cm in diameter and medially in the right lower lobe. It is inseparable from the posterior left pericardium and the medial left pleural surface. It demonstrates average attenuation value of 51.  Lungs/Pleura: The lungs are clear. There is no evidence of pleural or pericardial effusion.  Musculoskeletal: Negative  CT ABDOMEN AND PELVIS FINDINGS  Hepatobiliary: Significant diffuse hepatic steatosis, more focally pronounced adjacent to the falciform ligament.  Pancreas: Normal  Spleen: Normal  Adrenals/Urinary Tract: Normal  Stomach/Bowel: Normal  Vascular/Lymphatic: Normal  Reproductive: The uterus is normal. There are bilateral adnexal cysts. There is a thin rimmed cysts in the left adnexa measuring 4.5 cm. There is a similar cyst  in the right adnexa measuring 3.8 cm.  Other: Trace free fluid in the cul-de-sac.  Musculoskeletal: Negative  IMPRESSION: No acute abnormality in the thorax. 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  Hepatic steatosis.  Prominent bilateral cystic lesions in the pelvis likely representing ovarian cyst. Associated small volume free fluid. Suggest pelvic ultrasound better characterize.   Electronically Signed   By: Esperanza Heir M.D.   On: 03/20/2015 15:31     EKG Interpretation   Date/Time:  Tuesday March 20 2015 12:31:27 EDT Ventricular Rate:  146 PR Interval:  112 QRS Duration: 54 QT Interval:  338 QTC Calculation: 526 R Axis:   -53 Text Interpretation:  Sinus tachycardia Left anterior fascicular block  Septal infarct , age undetermined No significant change since last tracing  Confirmed by [facility]  MD, Alphonzo Lemmings ([id number]) on 03/20/2015 12:44:35 PM      MDM   Final diagnoses:  Non-intractable vomiting with nausea, vomiting of unspecified type  Other chest pain  Dehydration    Pt. Is a 24 y/o F with chronic pancreatitis vs. Vomiting syndrome and ? Hx of cardiac mass. Unclear exactly what her diagnosis are at  this time. She does have worse abdominal / low chest pain radiating to her back. EKG with sinus tachycardia. Will get CXR, Lipase, CBC, CMET. Will give 1L bolus. May be dehydrated as a result of chronic nausea / vomiting.   Patient hydrated with 2L of NS. Given phenergan for nausea with some improvement. Her heart rate did come down to the low 100's. Workup was negative for cardiac etiology or infectious etiology. CT chest, abd, pelvis without any identifying factors. U/A with evidence of dehydration. She needs f/u with her pcp for ongoing management / workup of her condition. Stable for discharge at this time with phenergan for nausea. Return precautions reviewed.    Yolande Jolly, MD 03/20/15 1557  Gwyneth Sprout, MD 03/21/15 [phone]

## 2015-03-20 NOTE — ED Notes (Signed)
CP started this am after getting upset/yelling

## 2015-03-20 NOTE — Discharge Instructions (Signed)
Chest Pain (Nonspecific) °It is often hard to give a specific diagnosis for the cause of chest pain. There is always a chance that your pain could be related to something serious, such as a heart attack or a blood clot in the lungs. You need to follow up with your health care provider for further evaluation. °CAUSES  °· Heartburn. °· Pneumonia or bronchitis. °· Anxiety or stress. °· Inflammation around your heart (pericarditis) or lung (pleuritis or pleurisy). °· A blood clot in the lung. °· A collapsed lung (pneumothorax). It can develop suddenly on its own (spontaneous pneumothorax) or from trauma to the chest. °· Shingles infection (herpes zoster virus). °The chest wall is composed of bones, muscles, and cartilage. Any of these can be the source of the pain. °· The bones can be bruised by injury. °· The muscles or cartilage can be strained by coughing or overwork. °· The cartilage can be affected by inflammation and become sore (costochondritis). °DIAGNOSIS  °Lab tests or other studies may be needed to find the cause of your pain. Your health care provider may have you take a test called an ambulatory electrocardiogram (ECG). An ECG records your heartbeat patterns over a 24-hour period. You may also have other tests, such as: °· Transthoracic echocardiogram (TTE). During echocardiography, sound waves are used to evaluate how blood flows through your heart. °· Transesophageal echocardiogram (TEE). °· Cardiac monitoring. This allows your health care provider to monitor your heart rate and rhythm in real time. °· Holter monitor. This is a portable device that records your heartbeat and can help diagnose heart arrhythmias. It allows your health care provider to track your heart activity for several days, if needed. °· Stress tests by exercise or by giving medicine that makes the heart beat faster. °TREATMENT  °· Treatment depends on what may be causing your chest pain. Treatment may include: °· Acid blockers for  heartburn. °· Anti-inflammatory medicine. °· Pain medicine for inflammatory conditions. °· Antibiotics if an infection is present. °· You may be advised to change lifestyle habits. This includes stopping smoking and avoiding alcohol, caffeine, and chocolate. °· You may be advised to keep your head raised (elevated) when sleeping. This reduces the chance of acid going backward from your stomach into your esophagus. °Most of the time, nonspecific chest pain will improve within 2-3 days with rest and mild pain medicine.  °HOME CARE INSTRUCTIONS  °· If antibiotics were prescribed, take them as directed. Finish them even if you start to feel better. °· For the next few days, avoid physical activities that bring on chest pain. Continue physical activities as directed. °· Do not use any tobacco products, including cigarettes, chewing tobacco, or electronic cigarettes. °· Avoid drinking alcohol. °· Only take medicine as directed by your health care provider. °· Follow your health care provider's suggestions for further testing if your chest pain does not go away. °· Keep any follow-up appointments you made. If you do not go to an appointment, you could develop lasting (chronic) problems with pain. If there is any problem keeping an appointment, call to reschedule. °SEEK MEDICAL CARE IF:  °· Your chest pain does not go away, even after treatment. °· You have a rash with blisters on your chest. °· You have a fever. °SEEK IMMEDIATE MEDICAL CARE IF:  °· You have increased chest pain or pain that spreads to your arm, neck, jaw, back, or abdomen. °· You have shortness of breath. °· You have an increasing cough, or you cough   up blood. °· You have severe back or abdominal pain. °· You feel nauseous or vomit. °· You have severe weakness. °· You faint. °· You have chills. °This is an emergency. Do not wait to see if the pain will go away. Get medical help at once. Call your local emergency services (911 in [place] Do not drive  yourself to the hospital. °MAKE SURE YOU:  °· Understand these instructions. °· Will watch your condition. °· Will get help right away if you are not doing well or get worse. °Document Released: 05/21/2005 Document Revised: 08/16/2013 Document Reviewed: 03/16/2008 °Exit[employer]® Patient Information ©2015 [employer]. This information is not intended to replace advice given to you by your health care provider. Make sure you discuss any questions you have with your health care provider. ° ° ° °Nausea and Vomiting °Nausea is a sick feeling that often comes before throwing up (vomiting). Vomiting is a reflex where stomach contents come out of your mouth. Vomiting can cause severe loss of body fluids (dehydration). Children and elderly adults can become dehydrated quickly, especially if they also have diarrhea. Nausea and vomiting are symptoms of a condition or disease. It is important to find the cause of your symptoms. °CAUSES  °· Direct irritation of the stomach lining. This irritation can result from increased acid production (gastroesophageal reflux disease), infection, food poisoning, taking certain medicines (such as nonsteroidal anti-inflammatory drugs), alcohol use, or tobacco use. °· Signals from the brain. These signals could be caused by a headache, heat exposure, an inner ear disturbance, increased pressure in the brain from injury, infection, a tumor, or a concussion, pain, emotional stimulus, or metabolic problems. °· An obstruction in the gastrointestinal tract (bowel obstruction). °· Illnesses such as diabetes, hepatitis, gallbladder problems, appendicitis, kidney problems, cancer, sepsis, atypical symptoms of a heart attack, or eating disorders. °· Medical treatments such as chemotherapy and radiation. °· Receiving medicine that makes you sleep (general anesthetic) during surgery. °DIAGNOSIS °Your caregiver may ask for tests to be done if the problems do not improve after a few days. Tests may also be  done if symptoms are severe or if the reason for the nausea and vomiting is not clear. Tests may include: °· Urine tests. °· Blood tests. °· Stool tests. °· Cultures (to look for evidence of infection). °· X-rays or other imaging studies. °Test results can help your caregiver make decisions about treatment or the need for additional tests. °TREATMENT °You need to stay well hydrated. Drink frequently but in small amounts. You may wish to drink water, sports drinks, clear broth, or eat frozen ice pops or gelatin dessert to help stay hydrated. When you eat, eating slowly may help prevent nausea. There are also some antinausea medicines that may help prevent nausea. °HOME CARE INSTRUCTIONS  °· Take all medicine as directed by your caregiver. °· If you do not have an appetite, do not force yourself to eat. However, you must continue to drink fluids. °· If you have an appetite, eat a normal diet unless your caregiver tells you differently. °¨ Eat a variety of complex carbohydrates (rice, wheat, potatoes, bread), lean meats, yogurt, fruits, and vegetables. °¨ Avoid high-fat foods because they are more difficult to digest. °· Drink enough water and fluids to keep your urine clear or pale yellow. °· If you are dehydrated, ask your caregiver for specific rehydration instructions. Signs of dehydration may include: °¨ Severe thirst. °¨ Dry lips and mouth. °¨ Dizziness. °¨ Dark urine. °¨ Decreasing urine frequency and amount. °¨ Confusion. °¨ Rapid   breathing or pulse. °SEEK IMMEDIATE MEDICAL CARE IF:  °· You have blood or brown flecks (like coffee grounds) in your vomit. °· You have black or bloody stools. °· You have a severe headache or stiff neck. °· You are confused. °· You have severe abdominal pain. °· You have chest pain or trouble breathing. °· You do not urinate at least once every 8 hours. °· You develop cold or clammy skin. °· You continue to vomit for longer than 24 to 48 hours. °· You have a fever. °MAKE SURE YOU:    °· Understand these instructions. °· Will watch your condition. °· Will get help right away if you are not doing well or get worse. °Document Released: 08/11/2005 Document Revised: 11/03/2011 Document Reviewed: 01/08/2011 °ExitCare® Patient Information ©2015 [employer]. This information is not intended to replace advice given to you by your health care provider. Make sure you discuss any questions you have with your health care provider. ° °

## 2015-03-20 NOTE — ED Notes (Signed)
amb to BR with out difficulty

## 2015-09-07 ENCOUNTER — Encounter (HOSPITAL_BASED_OUTPATIENT_CLINIC_OR_DEPARTMENT_OTHER): Payer: Self-pay | Admitting: *Deleted

## 2015-09-07 ENCOUNTER — Emergency Department (HOSPITAL_BASED_OUTPATIENT_CLINIC_OR_DEPARTMENT_OTHER)
Admission: EM | Admit: 2015-09-07 | Discharge: 2015-09-07 | Disposition: A | Discharge status: Home / Self Care | Payer: Commercial Managed Care - HMO | Attending: Emergency Medicine | Admitting: Emergency Medicine

## 2015-09-07 DIAGNOSIS — M545 Low back pain: Secondary | ICD-10-CM | POA: Diagnosis present

## 2015-09-07 DIAGNOSIS — R112 Nausea with vomiting, unspecified: Secondary | ICD-10-CM | POA: Insufficient documentation

## 2015-09-07 DIAGNOSIS — Z8709 Personal history of other diseases of the respiratory system: Secondary | ICD-10-CM | POA: Diagnosis not present

## 2015-09-07 DIAGNOSIS — Z8719 Personal history of other diseases of the digestive system: Secondary | ICD-10-CM | POA: Insufficient documentation

## 2015-09-07 DIAGNOSIS — R61 Generalized hyperhidrosis: Secondary | ICD-10-CM | POA: Diagnosis not present

## 2015-09-07 DIAGNOSIS — Z3202 Encounter for pregnancy test, result negative: Secondary | ICD-10-CM | POA: Insufficient documentation

## 2015-09-07 DIAGNOSIS — Z87891 Personal history of nicotine dependence: Secondary | ICD-10-CM | POA: Insufficient documentation

## 2015-09-07 DIAGNOSIS — R509 Fever, unspecified: Secondary | ICD-10-CM | POA: Diagnosis not present

## 2015-09-07 LAB — URINALYSIS, ROUTINE W REFLEX MICROSCOPIC
Bilirubin Urine: NEGATIVE
Glucose, UA: NEGATIVE mg/dL
Ketones, ur: NEGATIVE mg/dL
Leukocytes, UA: NEGATIVE
Nitrite: NEGATIVE
Protein, ur: NEGATIVE mg/dL
Specific Gravity, Urine: 1.02 (ref 1.005–1.030)
pH: 5.5 (ref 5.0–8.0)

## 2015-09-07 LAB — URINE MICROSCOPIC-ADD ON

## 2015-09-07 LAB — PREGNANCY, URINE: Preg Test, Ur: NEGATIVE

## 2015-09-07 MED ORDER — NAPROXEN 375 MG PO TABS: 375.0000 mg | ORAL_TABLET | Freq: Two times a day (BID) | ORAL | Status: DC | PRN

## 2015-09-07 NOTE — ED Notes (Signed)
Lower back pain x 4 days. Worse on the left side. Legs hurt. Hx of pancreatitis. Nausea last night. Sweating at night. She vomited last night and it had streaks of blood which she states is normal for her when she vomits.

## 2015-09-07 NOTE — Discharge Instructions (Signed)

## 2015-09-09 LAB — URINE CULTURE

## 2015-09-19 NOTE — ED Provider Notes (Signed)
CSN: [id number]     Arrival date & time 09/07/15  1124 History   First MD Initiated Contact with Patient 09/07/15 1137     Chief Complaint  Patient presents with  . Back Pain  . UTI      (Consider location/radiation/quality/duration/timing/severity/associated sxs/prior Treatment) HPI   25yfemale with lower back pain. Tangential historian. She reports gradual onset of lower back pain impression 4-5 days ago. Denies any acute trauma or overuse. Pain is across lower back, but some are worse on the left side. Her upper legs hurt as well but doesn't necessarily describe radiating pain. Pain is worse with ambulating. Patient has many other complaints on review of systems which and somewhat difficult to tease out what is actually potentially related to her back pain. She goes on about a past history of pancreatitis. She had some nausea last night and had one episode of vomiting. She says this is not unusual for her. Didsome streaks of blood in it. No blood thinners. significant NSAID usage. Subjective chills. Subjective fever. Sometimes has night sweats. Denies any 7 weight loss.  Past Medical History  Diagnosis Date  . Mass of heart   . 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 Appendectomy     Family History  Problem Relation Age of Onset  . Hypertension Mother    Social History  Substance Use Topics  . Smoking status: Former Games developer  . Smokeless tobacco: None  . Alcohol Use: No   OB History    No data available     Review of Systems  All systems reviewed and negative, other than as noted in HPI.   Allergies  Codeine  Home Medications   Prior to Admission medications   Medication Sig Start Date End Date Taking? Authorizing Provider  naproxen (NAPROSYN) 375 MG tablet Take 1 tablet (375 mg total) by mouth 2 (two) times daily as needed (pain). 09/07/15   Raeford Razor, MD  promethazine (PHENERGAN) 12.5 MG suppository Place  1 suppository (12.5 mg total) rectally every 6 (six) hours as needed for nausea or vomiting. 03/20/15   Hillery Hunter Melancon, MD   BP 149/114 mmHg  Pulse 89  Temp(Src) 98.2 F (36.8 C) (Oral)  Resp 20  Ht  (1.753 m)  Wt 170 lb (77.111 kg)  BMI 25.09 kg/m2  SpO2 98%  LMP 08/31/2015 Physical Exam  Constitutional: She appears well-developed and well-nourished. No distress.  HENT:  Head: Normocephalic and atraumatic.  Eyes: Conjunctivae are normal. Right eye exhibits no discharge. Left eye exhibits no discharge.  Neck: Neck supple.  Cardiovascular: Normal rate, regular rhythm and normal heart sounds.  Exam reveals no gallop and no friction rub.   No murmur heard. Pulmonary/Chest: Effort normal and breath sounds normal. No respiratory distress.  Abdominal: Soft. She exhibits no distension. There is no tenderness.  Musculoskeletal: She exhibits no edema or tenderness.  Mild tenderness palpation across the lumbar spine. This does not seem to be particularly worse in the midline. There are no concerning skin changes. Strength is 5 out of 5 bilateral lower extremities. Sensation is intact to light touch. Palpable DP pulses bilaterally.  Neurological: She is alert.  Skin: Skin is warm and dry.  Psychiatric: She has a normal mood and affect. Her behavior is normal. Thought content normal.  Nursing note and vitals reviewed.   ED Course  Procedures (including critical care time) Labs Review Labs Reviewed  URINALYSIS, ROUTINE W REFLEX  MICROSCOPIC (NOT AT [facility]) - Abnormal; Notable for the following:    APPearance CLOUDY (*)    Hgb urine dipstick TRACE (*)    All other components within normal limits  URINE MICROSCOPIC-ADD ON - Abnormal; Notable for the following:    Squamous Epithelial / LPF 0-5 (*)    Bacteria, UA FEW (*)    All other components within normal limits  URINE CULTURE  PREGNANCY, URINE    Imaging Review No results found. I have personally reviewed and evaluated these  images and lab results as part of my medical decision-making.   EKG Interpretation None      MDM   Final diagnoses:  Low back pain, unspecified back pain laterality, with sciatica presence unspecified    25 year old female with lower back pain without particular concerning features. Plan symptomatic treatment. UA without signs of infection. Return precautions were discussed.    Raeford Razor, MD 09/19/15 1357

## 2016-04-24 ENCOUNTER — Emergency Department (HOSPITAL_BASED_OUTPATIENT_CLINIC_OR_DEPARTMENT_OTHER)
Admission: EM | Admit: 2016-04-24 | Discharge: 2016-04-24 | Disposition: A | Discharge status: Home / Self Care | Payer: Commercial Managed Care - HMO | Attending: Emergency Medicine | Admitting: Emergency Medicine

## 2016-04-24 ENCOUNTER — Encounter (HOSPITAL_BASED_OUTPATIENT_CLINIC_OR_DEPARTMENT_OTHER): Payer: Self-pay | Admitting: *Deleted

## 2016-04-24 ENCOUNTER — Emergency Department (HOSPITAL_BASED_OUTPATIENT_CLINIC_OR_DEPARTMENT_OTHER): Payer: Commercial Managed Care - HMO

## 2016-04-24 DIAGNOSIS — R350 Frequency of micturition: Secondary | ICD-10-CM | POA: Diagnosis not present

## 2016-04-24 DIAGNOSIS — R42 Dizziness and giddiness: Secondary | ICD-10-CM | POA: Diagnosis not present

## 2016-04-24 DIAGNOSIS — R002 Palpitations: Secondary | ICD-10-CM | POA: Insufficient documentation

## 2016-04-24 DIAGNOSIS — R110 Nausea: Secondary | ICD-10-CM | POA: Insufficient documentation

## 2016-04-24 DIAGNOSIS — Z79899 Other long term (current) drug therapy: Secondary | ICD-10-CM | POA: Diagnosis not present

## 2016-04-24 DIAGNOSIS — Z87891 Personal history of nicotine dependence: Secondary | ICD-10-CM | POA: Diagnosis not present

## 2016-04-24 DIAGNOSIS — M549 Dorsalgia, unspecified: Secondary | ICD-10-CM | POA: Insufficient documentation

## 2016-04-24 DIAGNOSIS — R200 Anesthesia of skin: Secondary | ICD-10-CM | POA: Diagnosis not present

## 2016-04-24 DIAGNOSIS — R0789 Other chest pain: Secondary | ICD-10-CM | POA: Insufficient documentation

## 2016-04-24 LAB — CBC WITH DIFFERENTIAL/PLATELET
Basophils Absolute: 0 10*3/uL (ref 0.0–0.1)
Basophils Relative: 0 %
EOS ABS: 0.1 10*3/uL (ref 0.0–0.7)
Eosinophils Relative: 2 %
HCT: 36.9 % (ref 36.0–46.0)
Hemoglobin: 13.6 g/dL (ref 12.0–15.0)
Lymphocytes Relative: 33 %
Lymphs Abs: 1.2 10*3/uL (ref 0.7–4.0)
MCH: 33.1 pg (ref 26.0–34.0)
MCHC: 36.9 g/dL — ABNORMAL HIGH (ref 30.0–36.0)
MCV: 89.8 fL (ref 78.0–100.0)
MONO ABS: 0.4 10*3/uL (ref 0.1–1.0)
MONOS PCT: 10 %
Neutro Abs: 1.9 10*3/uL (ref 1.7–7.7)
Neutrophils Relative %: 55 %
Platelets: 227 10*3/uL (ref 150–400)
RBC: 4.11 MIL/uL (ref 3.87–5.11)
RDW: 11.4 % — AB (ref 11.5–15.5)
WBC: 3.5 10*3/uL — ABNORMAL LOW (ref 4.0–10.5)

## 2016-04-24 LAB — COMPREHENSIVE METABOLIC PANEL
ALBUMIN: 4.3 g/dL (ref 3.5–5.0)
ALT: 18 U/L (ref 14–54)
ANION GAP: 8 (ref 5–15)
AST: 20 U/L (ref 15–41)
Alkaline Phosphatase: 36 U/L — ABNORMAL LOW (ref 38–126)
BILIRUBIN TOTAL: 1.1 mg/dL (ref 0.3–1.2)
BUN: 8 mg/dL (ref 6–20)
CO2: 22 mmol/L (ref 22–32)
Calcium: 9.5 mg/dL (ref 8.9–10.3)
Chloride: 106 mmol/L (ref 101–111)
Creatinine, Ser: 0.78 mg/dL (ref 0.44–1.00)
GFR calc non Af Amer: >60 mL/min (ref >60)
GLUCOSE: 101 mg/dL — AB (ref 65–99)
POTASSIUM: 3.5 mmol/L (ref 3.5–5.1)
SODIUM: 136 mmol/L (ref 135–145)
Total Protein: 7.2 g/dL (ref 6.5–8.1)

## 2016-04-24 LAB — HCG, SERUM, QUALITATIVE: Preg, Serum: NEGATIVE

## 2016-04-24 LAB — LIPASE, BLOOD: Lipase: 23 U/L (ref 11–51)

## 2016-04-24 LAB — TROPONIN I

## 2016-04-24 MED ORDER — CYCLOBENZAPRINE HCL 10 MG PO TABS: 10.0000 mg | ORAL_TABLET | Freq: Two times a day (BID) | ORAL | 0 refills | Status: DC | PRN

## 2016-04-24 MED ORDER — CYCLOBENZAPRINE HCL 5 MG PO TABS
5.0000 mg | ORAL_TABLET | Freq: Once | ORAL | Status: AC
  Administered 2016-04-24: 5 mg via ORAL
  Filled 2016-04-24: qty 1

## 2016-04-24 MED ORDER — KETOROLAC TROMETHAMINE 30 MG/ML IJ SOLN
30.0000 mg | Freq: Once | INTRAMUSCULAR | Status: AC
  Administered 2016-04-24: 30 mg via INTRAVENOUS
  Filled 2016-04-24: qty 1

## 2016-04-24 MED FILL — CYCLOBENZAPRINE 10 MG TAB: 10 | 5 days supply | Qty: 10 | Fill #0

## 2016-04-24 NOTE — ED Provider Notes (Signed)
[facility]   CSN: [id number] Arrival date & time: 04/24/16  4098     History   Chief Complaint Chief Complaint  Patient presents with  . Chest Pain    HPI Chelsey Hall is a 25 y.o. female with history of a mediastinal cyst and chronic pancreatitis who presents with 2 days of chest and shoulder pain.  She first noticed some sharp, shooting pain under her left breast which radiates to her belly, shoulder (left more than right), and back.  This is associated with nausea and pain with deep breaths.  Today, her shoulder pain became more prominent, with numbness/tingling in her bilateral arms.  Denies dyspnea and diaphoresis  No injury, eye pain, scalp tenderness, or jaw claudication.  She has chronic dysphagia and abdominal pain, which are unchanged.   HPI  Past Medical History:  Diagnosis Date  . Mass of heart   . 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   Past Surgical History:  Procedure Laterality Date  . APPENDECTOMY      OB History    No data available       Home Medications    Prior to Admission medications   Medication Sig Start Date End Date Taking? Authorizing Provider  lisinopril (PRINIVIL,ZESTRIL) 20 MG tablet Take 20 mg by mouth daily.   Yes Historical Provider, MD  naproxen (NAPROSYN) 375 MG tablet Take 1 tablet (375 mg total) by mouth 2 (two) times daily as needed (pain). 09/07/15   Raeford Razor, MD  promethazine (PHENERGAN) 12.5 MG suppository Place 1 suppository (12.5 mg total) rectally every 6 (six) hours as needed for nausea or vomiting. 03/20/15   Yolande Jolly, MD    Family History Family History  Problem Relation Age of Onset  . Hypertension Mother     Social History Social History  Substance Use Topics  . Smoking status: Former Games developer  . Smokeless tobacco: Not on file  . Alcohol use No     Allergies   Codeine   Review of  Systems Review of Systems  Constitutional: Negative for fever.  HENT: Positive for trouble swallowing.        Temporal pain R > L, worse with chewing  Eyes: Negative for photophobia, pain and redness.  Respiratory: Positive for chest tightness. Negative for cough and shortness of breath.   Cardiovascular: Positive for chest pain and palpitations.  Gastrointestinal: Positive for abdominal pain and nausea. Negative for vomiting.  Genitourinary: Positive for frequency. Negative for dysuria and hematuria.  Musculoskeletal: Positive for back pain.  Neurological: Positive for dizziness and numbness. Negative for syncope, weakness and headaches.     Physical Exam Updated Vital Signs BP 167/96 (BP Location: Right Arm)   Pulse 77   Temp 98.6 F (37 C) (Oral)   Resp 18   Ht 5\' 9"  (1.753 m)   Wt 83.9 kg   LMP 04/17/2016   SpO2 100%   BMI 27.32 kg/m   Physical Exam  Constitutional: She is oriented to person, place, and time. She appears well-developed and well-nourished. No distress.  HENT:  Head: Normocephalic and atraumatic.  Eyes: Conjunctivae and EOM are normal. Pupils are equal, round, and reactive to light. No scleral icterus.  Neck: Normal range of motion. Neck supple. No JVD present.  TTP L supraclavicular area/SCM  Cardiovascular: Normal rate, regular rhythm, normal heart sounds and intact distal pulses.   Pulmonary/Chest: Effort normal and  breath sounds normal.  Abdominal: Soft. She exhibits no distension. There is tenderness.  Musculoskeletal: She exhibits no edema or deformity.  Diffuse chest well tenderness, over costochondral junctions and ribs  Lymphadenopathy:    She has no cervical adenopathy.  Neurological: She is alert and oriented to person, place, and time.  Skin: Skin is warm and dry.  Psychiatric: She has a normal mood and affect. Her behavior is normal.     ED Treatments / Results  Labs (all labs ordered are listed, but only abnormal results are  displayed) Labs Reviewed  CBC WITH DIFFERENTIAL/PLATELET - Abnormal; Notable for the following:       Result Value   WBC 3.5 (*)    MCHC 36.9 (*)    RDW 11.4 (*)    All other components within normal limits  COMPREHENSIVE METABOLIC PANEL  LIPASE, BLOOD  HCG, SERUM, QUALITATIVE  TROPONIN I    EKG  EKG Interpretation  Date/Time:  Thursday April 24 2016 09:43:29 EDT Ventricular Rate:  83 PR Interval:    QRS Duration: 76 QT Interval:  386 QTC Calculation: 454 R Axis:   -42 Text Interpretation:  Sinus rhythm Left axis deviation Abnormal Q suggests anterior infarct Borderline T abnormalities, inferior leads No significant change since last tracing Confirmed by YAO  MD, DAVID ([id number]) on 04/24/2016 9:56:05 AM       Radiology No results found.  Procedures Procedures (including critical care time)  Medications Ordered in ED Medications  cyclobenzaprine (FLEXERIL) tablet 5 mg (not administered)     Initial Impression / Assessment and Plan / ED Course  I have reviewed the triage vital signs and the nursing notes.  Pertinent labs & imaging results that were available during my care of the patient were reviewed by me and considered in my medical decision making (see chart for details).  Differential is broad, but most likely represents MSK pain with chest/shoulder pain reproducible with movement and palpation.  Other causes of chest pain considered, including ACS, dissection, Boerhaave's, PTX.  No scalp tenderness, jaw claudication, eye pain, or visual changes to suggest PMR/GCA.  -CXR -EKG -Trop -Lipase -CBC -CMP  Clinical Course    Final Clinical Impressions(s) / ED Diagnoses   Final diagnoses:  Chest wall pain   Normal labs, CXR, and unchanged EKG.  Pain reproducible with palpation and movement. Discharge home, will follow up with PCP in [place].  New Prescriptions New Prescriptions   No medications on file     Alm BustardMatthew O'Sullivan, MD 04/24/16 1422     Charlynne Panderavid Hsienta Yao, MD 04/24/16 1440

## 2016-04-24 NOTE — ED Triage Notes (Signed)
Pt reports substernal CP 2 days ago-reproducible on palpation and with movement.  Pt reports nausea, denies vomiting.  Ambulatory.

## 2016-04-24 NOTE — ED Notes (Signed)
Pt alert, NAD, calm, interactive, resps e/u, speaking in clear complete sentences, no dyspnea noted, updated, pending xray results, reports pain unchanged, worse with movement and inspiration, and light headedness (relates to shallow breathing), also some intermitant nausea (denies: sob, or nausea at this time). VSS. BP remains elevated. NSR.

## 2016-04-24 NOTE — Discharge Instructions (Signed)
You were evaluated in the ED for chest pain.  We did not find any evidence of dangerous problems including heart attack.  You pain is most likely muscular.  You can take over-the-counter ibuprofen and tylenol for pain.  I have also given you a prescription for flexeril, which is a muscle relaxant, which you can take if needed.  If you have new or worse chest pain, shortness of breath, sweating, nausea, or pass out, please come back to the emergency department.  Otherwise, follow up with your primary doctor in [place] and your cardiothoracic surgeon for the cyst.

## 2016-04-24 NOTE — ED Triage Notes (Signed)
Pt reports hx of pancreatitis-being treated at this time.  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

## 2016-11-18 ENCOUNTER — Emergency Department (HOSPITAL_BASED_OUTPATIENT_CLINIC_OR_DEPARTMENT_OTHER)
Admission: EM | Admit: 2016-11-18 | Discharge: 2016-11-18 | Disposition: A | Discharge status: Home / Self Care | Payer: Commercial Managed Care - HMO | Attending: Emergency Medicine | Admitting: Emergency Medicine

## 2016-11-18 ENCOUNTER — Encounter (HOSPITAL_BASED_OUTPATIENT_CLINIC_OR_DEPARTMENT_OTHER): Payer: Self-pay | Admitting: *Deleted

## 2016-11-18 ENCOUNTER — Emergency Department (HOSPITAL_BASED_OUTPATIENT_CLINIC_OR_DEPARTMENT_OTHER): Payer: Commercial Managed Care - HMO

## 2016-11-18 DIAGNOSIS — J0110 Acute frontal sinusitis, unspecified: Secondary | ICD-10-CM | POA: Insufficient documentation

## 2016-11-18 DIAGNOSIS — F17200 Nicotine dependence, unspecified, uncomplicated: Secondary | ICD-10-CM | POA: Diagnosis not present

## 2016-11-18 DIAGNOSIS — R0602 Shortness of breath: Secondary | ICD-10-CM | POA: Diagnosis not present

## 2016-11-18 DIAGNOSIS — R0789 Other chest pain: Secondary | ICD-10-CM

## 2016-11-18 DIAGNOSIS — J9801 Acute bronchospasm: Secondary | ICD-10-CM

## 2016-11-18 DIAGNOSIS — Z79899 Other long term (current) drug therapy: Secondary | ICD-10-CM | POA: Insufficient documentation

## 2016-11-18 DIAGNOSIS — R079 Chest pain, unspecified: Secondary | ICD-10-CM | POA: Diagnosis present

## 2016-11-18 HISTORY — DX: Headache: R51

## 2016-11-18 HISTORY — DX: Disease of pericardium, unspecified: I31.9

## 2016-11-18 HISTORY — DX: Headache, unspecified: R51.9

## 2016-11-18 LAB — D-DIMER, QUANTITATIVE: D-Dimer, Quant: 0.34 ug/mL-FEU (ref 0.00–0.50)

## 2016-11-18 LAB — COMPREHENSIVE METABOLIC PANEL
ALBUMIN: 4.4 g/dL (ref 3.5–5.0)
ALK PHOS: 47 U/L (ref 38–126)
ALT: 22 U/L (ref 14–54)
AST: 21 U/L (ref 15–41)
Anion gap: 6 (ref 5–15)
BILIRUBIN TOTAL: 0.9 mg/dL (ref 0.3–1.2)
BUN: 9 mg/dL (ref 6–20)
CO2: 25 mmol/L (ref 22–32)
Calcium: 9.3 mg/dL (ref 8.9–10.3)
Chloride: 107 mmol/L (ref 101–111)
Creatinine, Ser: 0.75 mg/dL (ref 0.44–1.00)
GFR calc Af Amer: >60 mL/min (ref >60)
GLUCOSE: 101 mg/dL — AB (ref 65–99)
POTASSIUM: 4.1 mmol/L (ref 3.5–5.1)
Sodium: 138 mmol/L (ref 135–145)
TOTAL PROTEIN: 7.6 g/dL (ref 6.5–8.1)

## 2016-11-18 LAB — CBC
HEMATOCRIT: 36.1 % (ref 36.0–46.0)
HEMOGLOBIN: 12.9 g/dL (ref 12.0–15.0)
MCH: 31.9 pg (ref 26.0–34.0)
MCHC: 35.7 g/dL (ref 30.0–36.0)
MCV: 89.1 fL (ref 78.0–100.0)
Platelets: 219 10*3/uL (ref 150–400)
RBC: 4.05 MIL/uL (ref 3.87–5.11)
RDW: 11.8 % (ref 11.5–15.5)
WBC: 3.7 10*3/uL — AB (ref 4.0–10.5)

## 2016-11-18 LAB — BRAIN NATRIURETIC PEPTIDE: B NATRIURETIC PEPTIDE 5: 3.8 pg/mL (ref 0.0–100.0)

## 2016-11-18 LAB — TSH: TSH: 0.429 u[IU]/mL (ref 0.350–4.500)

## 2016-11-18 LAB — T4, FREE: FREE T4: 0.8 ng/dL (ref 0.61–1.12)

## 2016-11-18 LAB — TROPONIN I: Troponin I: <0.03 ng/mL (ref <0.03)

## 2016-11-18 MED ORDER — ALBUTEROL SULFATE (2.5 MG/3ML) 0.083% IN NEBU
5.0000 mg | INHALATION_SOLUTION | Freq: Once | RESPIRATORY_TRACT | Status: AC
  Administered 2016-11-18: 5 mg via RESPIRATORY_TRACT
  Filled 2016-11-18: qty 6

## 2016-11-18 MED ORDER — PREDNISONE 20 MG PO TABS: ORAL_TABLET | ORAL | 0 refills | Status: DC

## 2016-11-18 MED ORDER — FLUTICASONE PROPIONATE 50 MCG/ACT NA SUSP: 2.0000 | Freq: Every day | NASAL | 0 refills | Status: DC

## 2016-11-18 MED ORDER — ALBUTEROL SULFATE HFA 108 (90 BASE) MCG/ACT IN AERS
1.0000 | INHALATION_SPRAY | RESPIRATORY_TRACT | Status: DC | PRN
  Administered 2016-11-18: 2 via RESPIRATORY_TRACT
  Filled 2016-11-18: qty 6.7

## 2016-11-18 MED ORDER — AZITHROMYCIN 250 MG PO TABS: 250.0000 mg | ORAL_TABLET | Freq: Every day | ORAL | 0 refills | Status: DC

## 2016-11-18 MED ORDER — LORATADINE 10 MG PO TABS: 10.0000 mg | ORAL_TABLET | Freq: Every day | ORAL | 0 refills | Status: DC

## 2016-11-18 MED FILL — AZITHROMYCIN 250 MG TABLET: 250 | 5 days supply | Qty: 6 | Fill #0

## 2016-11-18 MED FILL — predniSONE 20 MG TABS: 20 | 5 days supply | Qty: 11 | Fill #0

## 2016-11-18 NOTE — ED Provider Notes (Signed)
[facility]-EMERGENCY DEPT Provider Note   CSN: [id number] Arrival date & time: 11/18/16  1138     History   Chief Complaint Chief Complaint  Patient presents with  . Headache  . Chest Pain    HPI Chelsey Coolshley Leggette is a 26 y.o. female.  HPI Patient states she's had several days of chest pain and headache. Worsened yesterday evening. Headache as gradual in onset. Mostly located in the right side of the forefoot region. Patient does have some pain to the left side of the neck that radiates up to the occipital scalp. She's had nasal congestion and postnasal drip. She denies any fever or chills. No photophobia, nausea or vomiting. Denies neck stiffness. No focal weakness or numbness. Patient also complains of left-sided chest pain. Describes the pain as sharp radiating up to her left shoulder. Worse with deep breathing. Patient also has shortness of breath associated with this. States she has difficulty taking a full deep breath. Has had cough is not productive of sputum. Denies any lower extremity tenderness or asymmetry. No recent changes to her medication. States her blood pressures been elevated at the last few days. Past Medical History:  Diagnosis Date  . Head ache   . Mass of heart   . 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 Pericarditis     There are no active problems to display for this patient.   Past Surgical History:  Procedure Laterality Date  . APPENDECTOMY      OB History    No data available       Home Medications    Prior to Admission medications   Medication Sig Start Date End Date Taking? Authorizing Provider  amLODipine (NORVASC) 10 MG tablet Take 10 mg by mouth daily.   Yes Historical Provider, MD  lisinopril (PRINIVIL,ZESTRIL) 20 MG tablet Take 20 mg by mouth daily.   Yes Historical Provider, MD  OMEPRAZOLE PO Take by mouth.   Yes Historical Provider, MD  Ondansetron HCl (ZOFRAN PO) Take by mouth.   Yes Historical Provider, MD  promethazine (PHENERGAN)  12.5 MG suppository Place 1 suppository (12.5 mg total) rectally every 6 (six) hours as needed for nausea or vomiting. 03/20/15  Yes Hillery Hunteraleb G Melancon, MD  azithromycin (ZITHROMAX) 250 MG tablet Take 1 tablet (250 mg total) by mouth daily. Take first 2 tablets together, then 1 every day until finished. 11/18/16   Loren Raceravid Delrae Hagey, MD  cyclobenzaprine (FLEXERIL) 10 MG tablet Take 1 tablet (10 mg total) by mouth 2 (two) times daily as needed for muscle spasms. 04/24/16   Alm BustardMatthew O'Sullivan, MD  fluticasone (FLONASE) 50 MCG/ACT nasal spray Place 2 sprays into both nostrils daily. 11/18/16   Loren Raceravid Cierah Crader, MD  loratadine (CLARITIN) 10 MG tablet Take 1 tablet (10 mg total) by mouth daily. 11/18/16   Loren Raceravid Auriah Hollings, MD  naproxen (NAPROSYN) 375 MG tablet Take 1 tablet (375 mg total) by mouth 2 (two) times daily as needed (pain). 09/07/15   Raeford RazorStephen Kohut, MD  predniSONE (DELTASONE) 20 MG tablet 3 tabs po day one, then 2 po daily x 4 days 11/18/16   Loren Raceravid Janiene Aarons, MD    Family History Family History  Problem Relation Age of Onset  . Hypertension Mother   . CAD Father     Social History Social History  Substance Use Topics  . Smoking status: Current Some Day Smoker  . Smokeless tobacco: Never Used  . Alcohol use No     Allergies   Codeine   Review of  Systems Review of Systems  Constitutional: Negative for chills and fever.  HENT: Positive for congestion, postnasal drip and sinus pressure. Negative for ear pain, sore throat, trouble swallowing and voice change.   Eyes: Negative for photophobia and visual disturbance.  Respiratory: Positive for cough, chest tightness and shortness of breath.   Cardiovascular: Positive for chest pain. Negative for palpitations and leg swelling.  Gastrointestinal: Negative for abdominal pain, diarrhea, nausea and vomiting.  Genitourinary: Negative for dysuria, flank pain, frequency and hematuria.  Musculoskeletal: Positive for myalgias and neck pain. Negative for  back pain and neck stiffness.  Skin: Negative for rash and wound.  Neurological: Positive for headaches. Negative for dizziness, weakness, light-headedness and numbness.  All other systems reviewed and are negative.    Physical Exam Updated Vital Signs BP 118/89   Pulse 86   Temp 99.4 F (37.4 C) (Oral)   Resp 16   Ht 5\' 8"  (1.727 m)   Wt 182 lb (82.6 kg)   LMP 10/13/2016   SpO2 100%   BMI 27.67 kg/m   Physical Exam  Constitutional: She is oriented to person, place, and time. She appears well-developed and well-nourished.  HENT:  Head: Normocephalic and atraumatic.  Mouth/Throat: Oropharynx is clear and moist.  Bilateral tonsillar hypertrophy. No exudates. Nasal mucosal edema bilaterally. Patient has tenderness to percussion over the left frontal sinus.  Eyes: EOM are normal. Pupils are equal, round, and reactive to light.  Neck: Normal range of motion. Neck supple. No tracheal deviation present. No thyromegaly present.  Tenderness to palpation over the left trapezium. No cervical lymphadenopathy. No thyromegaly. No meningismus  Cardiovascular: Normal rate and regular rhythm.  Exam reveals no gallop and no friction rub.   No murmur heard. Pulmonary/Chest: Effort normal. No stridor. She exhibits no tenderness.  Diminished breath sounds in bilateral bases.  Abdominal: Soft. Bowel sounds are normal. There is no tenderness. There is no rebound and no guarding.  Musculoskeletal: Normal range of motion. She exhibits no edema or tenderness.  No lower extremity swelling, asymmetry or tenderness.. 2+ distal pulses.  Lymphadenopathy:    She has no cervical adenopathy.  Neurological: She is alert and oriented to person, place, and time.  Patient is alert and oriented x3 with clear, goal oriented speech. Patient has 5/5 motor in all extremities. Sensation is intact to light touch. Patient has a normal gait and walks without assistance.  Skin: Skin is warm and dry. Capillary refill takes  less than 2 seconds. No rash noted. No erythema.  Psychiatric: She has a normal mood and affect. Her behavior is normal.  Nursing note and vitals reviewed.    ED Treatments / Results  Labs (all labs ordered are listed, but only abnormal results are displayed) Labs Reviewed  CBC - Abnormal; Notable for the following:       Result Value   WBC 3.7 (*)    All other components within normal limits  COMPREHENSIVE METABOLIC PANEL - Abnormal; Notable for the following:    Glucose, Bld 101 (*)    All other components within normal limits  TROPONIN I  BRAIN NATRIURETIC PEPTIDE  D-DIMER, QUANTITATIVE (NOT AT [facility])  TSH  T4, FREE    EKG  EKG Interpretation  Date/Time:  Tuesday November 18 2016 12:08:17 EDT Ventricular Rate:  101 PR Interval:  158 QRS Duration: 64 QT Interval:  344 QTC Calculation: 446 R Axis:   -37 Text Interpretation:  Sinus tachycardia Left axis deviation Low voltage QRS Cannot rule out Anterior  infarct , age undetermined Abnormal ECG Confirmed by Ranae Palms  MD, Azel Gumina [id number]) on 11/18/2016 1:58:18 PM       Radiology No results found.  Procedures Procedures (including critical care time)  Medications Ordered in ED Medications  albuterol (PROVENTIL) (2.5 MG/3ML) 0.083% nebulizer solution 5 mg (5 mg Nebulization Given 11/18/16 1351)     Initial Impression / Assessment and Plan / ED Course  I have reviewed the triage vital signs and the nursing notes.  Pertinent labs & imaging results that were available during my care of the patient were reviewed by me and considered in my medical decision making (see chart for details).    Patient states she is feeling much better. Chest pain is atypical. Troponin and d-dimer are normal. Patient has normal chest x-ray. No red flag signs or symptoms regarding patient's headache. Likely due to sinus disease. Return precautions.   Final Clinical Impressions(s) / ED Diagnoses   Final diagnoses:  Acute frontal sinusitis,  recurrence not specified  Atypical chest pain  Bronchospasm, acute    New Prescriptions Discharge Medication List as of 11/18/2016  3:57 PM    START taking these medications   Details  azithromycin (ZITHROMAX) 250 MG tablet Take 1 tablet (250 mg total) by mouth daily. Take first 2 tablets together, then 1 every day until finished., Starting Tue 11/18/2016, Print    fluticasone (FLONASE) 50 MCG/ACT nasal spray Place 2 sprays into both nostrils daily., Starting Tue 11/18/2016, Print    loratadine (CLARITIN) 10 MG tablet Take 1 tablet (10 mg total) by mouth daily., Starting Tue 11/18/2016, Print    predniSONE (DELTASONE) 20 MG tablet 3 tabs po day one, then 2 po daily x 4 days, Print         Loren Racer, MD 11/20/16 1512

## 2016-11-18 NOTE — ED Triage Notes (Signed)
Headache since last night. States hx of headaches. Her BP was elevated last night when she checked it. States she is having tightness in her chest going into her left shoulder.

## 2017-05-29 ENCOUNTER — Emergency Department (HOSPITAL_COMMUNITY)
Admission: EM | Admit: 2017-05-29 | Discharge: 2017-05-29 | Disposition: A | Discharge status: Home / Self Care | Payer: Commercial Managed Care - HMO | Attending: Emergency Medicine | Admitting: Emergency Medicine

## 2017-05-29 ENCOUNTER — Encounter (HOSPITAL_COMMUNITY): Payer: Self-pay | Admitting: Emergency Medicine

## 2017-05-29 ENCOUNTER — Emergency Department (HOSPITAL_COMMUNITY): Payer: Commercial Managed Care - HMO

## 2017-05-29 DIAGNOSIS — R110 Nausea: Secondary | ICD-10-CM | POA: Insufficient documentation

## 2017-05-29 DIAGNOSIS — G43A0 Cyclical vomiting, not intractable: Secondary | ICD-10-CM | POA: Diagnosis not present

## 2017-05-29 DIAGNOSIS — R079 Chest pain, unspecified: Secondary | ICD-10-CM | POA: Diagnosis not present

## 2017-05-29 DIAGNOSIS — R1013 Epigastric pain: Secondary | ICD-10-CM

## 2017-05-29 DIAGNOSIS — Z79899 Other long term (current) drug therapy: Secondary | ICD-10-CM | POA: Diagnosis not present

## 2017-05-29 DIAGNOSIS — Z8719 Personal history of other diseases of the digestive system: Secondary | ICD-10-CM | POA: Diagnosis not present

## 2017-05-29 DIAGNOSIS — F17200 Nicotine dependence, unspecified, uncomplicated: Secondary | ICD-10-CM | POA: Diagnosis not present

## 2017-05-29 DIAGNOSIS — R109 Unspecified abdominal pain: Secondary | ICD-10-CM | POA: Diagnosis present

## 2017-05-29 DIAGNOSIS — R1115 Cyclical vomiting syndrome unrelated to migraine: Secondary | ICD-10-CM

## 2017-05-29 LAB — I-STAT BETA HCG BLOOD, ED (MC, WL, AP ONLY): I-stat hCG, quantitative: <5 m[IU]/mL (ref <5)

## 2017-05-29 LAB — COMPREHENSIVE METABOLIC PANEL
ALBUMIN: 4.3 g/dL (ref 3.5–5.0)
ALK PHOS: 45 U/L (ref 38–126)
ALT: 19 U/L (ref 14–54)
AST: 22 U/L (ref 15–41)
Anion gap: 9 (ref 5–15)
BILIRUBIN TOTAL: 1 mg/dL (ref 0.3–1.2)
BUN: 8 mg/dL (ref 6–20)
CALCIUM: 9.5 mg/dL (ref 8.9–10.3)
CO2: 21 mmol/L — ABNORMAL LOW (ref 22–32)
Chloride: 104 mmol/L (ref 101–111)
Creatinine, Ser: 0.96 mg/dL (ref 0.44–1.00)
GFR calc Af Amer: >60 mL/min (ref >60)
GLUCOSE: 128 mg/dL — AB (ref 65–99)
Potassium: 3.8 mmol/L (ref 3.5–5.1)
Sodium: 134 mmol/L — ABNORMAL LOW (ref 135–145)
TOTAL PROTEIN: 7.7 g/dL (ref 6.5–8.1)

## 2017-05-29 LAB — CBC
HCT: 38.5 % (ref 36.0–46.0)
Hemoglobin: 13.4 g/dL (ref 12.0–15.0)
MCH: 31.3 pg (ref 26.0–34.0)
MCHC: 34.8 g/dL (ref 30.0–36.0)
MCV: 90 fL (ref 78.0–100.0)
Platelets: 218 10*3/uL (ref 150–400)
RBC: 4.28 MIL/uL (ref 3.87–5.11)
RDW: 12.3 % (ref 11.5–15.5)
WBC: 7.6 10*3/uL (ref 4.0–10.5)

## 2017-05-29 LAB — I-STAT TROPONIN, ED: TROPONIN I, POC: 0 ng/mL (ref 0.00–0.08)

## 2017-05-29 LAB — URINALYSIS, ROUTINE W REFLEX MICROSCOPIC
BILIRUBIN URINE: NEGATIVE
GLUCOSE, UA: 50 mg/dL — AB
HGB URINE DIPSTICK: NEGATIVE
KETONES UR: 80 mg/dL — AB
Leukocytes, UA: NEGATIVE
Nitrite: NEGATIVE
PH: 5 (ref 5.0–8.0)
PROTEIN: NEGATIVE mg/dL
Specific Gravity, Urine: 1.02 (ref 1.005–1.030)

## 2017-05-29 LAB — LIPASE, BLOOD: Lipase: 31 U/L (ref 11–51)

## 2017-05-29 MED ORDER — SODIUM CHLORIDE 0.9 % IV BOLUS (SEPSIS)
1000.0000 mL | Freq: Once | INTRAVENOUS | Status: AC
  Administered 2017-05-29: 1000 mL via INTRAVENOUS

## 2017-05-29 MED ORDER — FAMOTIDINE 20 MG PO TABS
40.0000 mg | ORAL_TABLET | Freq: Once | ORAL | Status: AC
  Administered 2017-05-29: 40 mg via ORAL
  Filled 2017-05-29: qty 2

## 2017-05-29 MED ORDER — METOCLOPRAMIDE HCL 5 MG/ML IJ SOLN
10.0000 mg | Freq: Once | INTRAMUSCULAR | Status: AC
  Administered 2017-05-29: 10 mg via INTRAVENOUS
  Filled 2017-05-29: qty 2

## 2017-05-29 MED ORDER — ONDANSETRON HCL 4 MG/2ML IJ SOLN
4.0000 mg | Freq: Once | INTRAMUSCULAR | Status: AC | PRN
  Administered 2017-05-29: 4 mg via INTRAVENOUS
  Filled 2017-05-29: qty 2

## 2017-05-29 MED ORDER — OMEPRAZOLE 20 MG PO CPDR: 20.0000 mg | DELAYED_RELEASE_CAPSULE | Freq: Every day | ORAL | 0 refills | Status: DC

## 2017-05-29 MED ORDER — PROMETHAZINE HCL 25 MG/ML IJ SOLN
25.0000 mg | Freq: Once | INTRAMUSCULAR | Status: AC
  Administered 2017-05-29: 25 mg via INTRAVENOUS
  Filled 2017-05-29: qty 1

## 2017-05-29 MED ORDER — PROMETHAZINE HCL 25 MG PO TABS: 25.0000 mg | ORAL_TABLET | Freq: Four times a day (QID) | ORAL | 0 refills | Status: DC | PRN

## 2017-05-29 NOTE — Discharge Instructions (Signed)
Please take Omeprazole for reflux/gastritis Avoid alcohol and NSAIDs (Ibuprofen, Advil, Naproxen, Aleve, etc) You can take Tylenol for pain Take Phenergan as needed for nausea and vomiting

## 2017-05-29 NOTE — ED Provider Notes (Signed)
[facility]-EMERGENCY DEPT Provider Note   CSN: [id number] Arrival date & time: 05/29/17  4098     History   Chief Complaint Chief Complaint  Patient presents with  . Abdominal Pain  . Chest Pain    HPI Chelsey Hall is a 26 y.o. female who presents with chest pain and abdominal pain. PMH significant for hx of pancreatitis, mediastinal cyst, cyclical vomiting. Past surgical hx significant for appendectomy. She states that the pain and vomiting started yesterday afternoon. It is in the epigastric and LUQ area and feels similar to prior episodes of pancreatitis. She denies fevers but reports sweats. She reports associated chest pain, N/V, constipation. She denies SOB, diarrhea, dysuria. LMP was a month ago. Denies ETOH but endorses marijuana use. She is a difficult historian - she is minimally interactive and requires repeated questioning.  HPI  Past Medical History:  Diagnosis Date  . Head ache   . Mass of heart   . 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 Pericarditis     There are no active problems to display for this patient.   Past Surgical History:  Procedure Laterality Date  . APPENDECTOMY      OB History    No data available       Home Medications    Prior to Admission medications   Medication Sig Start Date End Date Taking? Authorizing Provider  amLODipine (NORVASC) 10 MG tablet Take 10 mg by mouth daily.    [provider]  azithromycin (ZITHROMAX) 250 MG tablet Take 1 tablet (250 mg total) by mouth daily. Take first 2 tablets together, then 1 every day until finished. 11/18/16   Loren Racer, MD  cyclobenzaprine (FLEXERIL) 10 MG tablet Take 1 tablet (10 mg total) by mouth 2 (two) times daily as needed for muscle spasms. 04/24/16   Alm Bustard, MD  fluticasone (FLONASE) 50 MCG/ACT nasal spray Place 2 sprays into both nostrils daily. 11/18/16   Loren Racer, MD  lisinopril (PRINIVIL,ZESTRIL) 20 MG tablet Take 20 mg by mouth daily.     [provider]  loratadine (CLARITIN) 10 MG tablet Take 1 tablet (10 mg total) by mouth daily. 11/18/16   Loren Racer, MD  naproxen (NAPROSYN) 375 MG tablet Take 1 tablet (375 mg total) by mouth 2 (two) times daily as needed (pain). 09/07/15   Raeford Razor, MD  OMEPRAZOLE PO Take by mouth.    [provider]  Ondansetron HCl (ZOFRAN PO) Take by mouth.    [provider]  predniSONE (DELTASONE) 20 MG tablet 3 tabs po day one, then 2 po daily x 4 days 11/18/16   Loren Racer, MD  promethazine (PHENERGAN) 12.5 MG suppository Place 1 suppository (12.5 mg total) rectally every 6 (six) hours as needed for nausea or vomiting. 03/20/15   Melancon, Hillery Hunter, MD    Family History Family History  Problem Relation Age of Onset  . Hypertension Mother   . CAD Father     Social History Social History  Substance Use Topics  . Smoking status: Current Some Day Smoker  . Smokeless tobacco: Never Used  . Alcohol use No     Allergies   Codeine   Review of Systems Review of Systems  Constitutional: Positive for diaphoresis. Negative for fever.  Respiratory: Negative for shortness of breath.   Cardiovascular: Positive for chest pain.  Gastrointestinal: Positive for abdominal pain, constipation, nausea and vomiting. Negative for diarrhea.  Genitourinary: Negative for dysuria.  All other systems reviewed and are  negative.    Physical Exam Updated Vital Signs BP (!) 169/113   Pulse 79   Temp 98.2 F (36.8 C) (Oral)   Resp 18   SpO2 100%   Physical Exam  Constitutional: She is oriented to person, place, and time. She appears well-developed and well-nourished. She is sleeping. She does not appear ill. No distress. She is sedated.  Sleepy, unable to give history  HENT:  Head: Normocephalic and atraumatic.  Eyes: Conjunctivae are normal. Right eye exhibits no discharge. Left eye exhibits no discharge. No scleral icterus.  Neck: Normal range of motion.    Cardiovascular: Normal rate and regular rhythm.  Exam reveals no gallop and no friction rub.   No murmur heard. Pulmonary/Chest: Effort normal and breath sounds normal. No respiratory distress. She has no wheezes. She has no rales. She exhibits no tenderness.  Abdominal: Soft. Bowel sounds are normal. She exhibits no distension and no mass. There is tenderness (epigastric and LUQ tenderness). There is no rebound and no guarding. No hernia.  Neurological: She is oriented to person, place, and time.  Skin: Skin is warm and dry.  Psychiatric: She has a normal mood and affect. Her behavior is normal.  Nursing note and vitals reviewed.    ED Treatments / Results  Labs (all labs ordered are listed, but only abnormal results are displayed) Labs Reviewed  COMPREHENSIVE METABOLIC PANEL - Abnormal; Notable for the following:       Result Value   Sodium 134 (*)    CO2 21 (*)    Glucose, Bld 128 (*)    All other components within normal limits  URINALYSIS, ROUTINE W REFLEX MICROSCOPIC - Abnormal; Notable for the following:    Glucose, UA 50 (*)    Ketones, ur 80 (*)    All other components within normal limits  LIPASE, BLOOD  CBC  I-STAT BETA HCG BLOOD, ED ([facility], [facility], [facility] ONLY)  I-STAT TROPONIN, ED    EKG  EKG Interpretation None       Radiology Dg Chest Portable 1 View  Result Date: 05/29/2017 CLINICAL DATA:  Chest pain EXAM: PORTABLE CHEST 1 VIEW COMPARISON:  11/18/2016 FINDINGS: The heart size and mediastinal contours are within normal limits. Both lungs are clear. The visualized skeletal structures are unremarkable. IMPRESSION: No active disease. Electronically Signed   By: Alcide Clever M.D.   On: 05/29/2017 07:24    Procedures Procedures (including critical care time)  Medications Ordered in ED Medications  ondansetron (ZOFRAN) injection 4 mg (4 mg Intravenous Given 05/29/17 0709)  promethazine (PHENERGAN) injection 25 mg (25 mg Intravenous Given 05/29/17 0730)  sodium  chloride 0.9 % bolus 1,000 mL (1,000 mLs Intravenous New Bag/Given 05/29/17 0730)  famotidine (PEPCID) tablet 40 mg (40 mg Oral Given 05/29/17 1124)  metoCLOPramide (REGLAN) injection 10 mg (10 mg Intravenous Given 05/29/17 1124)     Initial Impression / Assessment and Plan / ED Course  I have reviewed the triage vital signs and the nursing notes.  Pertinent labs & imaging results that were available during my care of the patient were reviewed by me and considered in my medical decision making (see chart for details).  26 year old female presents with epigastric abdominal pain, N/V. She is hypertensive but otherwise vitals are normal. Zofran given in triage did no provide relief although she is not have actual vomiting and is spitting up saliva. Phenergan was ordered. On my initial exam she is unable to give a history due to sleepiness. Abdomen  is soft and tender in the epigastric and LUQ. Labs are overall unremarkable. Preg test is negative. EKG is NSR. CXR is negative. Troponin is 0. Lipase is normal. Symptoms likely gastritis/GERD vs cannabinoid hyperemesis. Will start fluids.   On recheck she still reports pain and N/V. Will give Pepcid and Reglan. Fluids are still running.  UA has resulted and shows 80 ketones. Pt reports she is not better but has tolerated Pepcid and is in NAD. After she finished fluids, will d/c with Phenergan and Omeprazole. She is agreeable.  Final Clinical Impressions(s) / ED Diagnoses   Final diagnoses:  Epigastric pain  Non-intractable cyclical vomiting with nausea    New Prescriptions New Prescriptions   No medications on file     Bethel Born, PA-C 05/29/17 1452    Benjiman Core, MD 05/29/17 [phone]

## 2017-05-29 NOTE — ED Triage Notes (Addendum)
Pt reports CP and abd pain with vomiting since midnight. Diaphoretic at this time. Hx pancreatitis.

## 2017-05-29 NOTE — ED Notes (Signed)
Pt ambulated to restroom. 

## 2017-05-30 ENCOUNTER — Emergency Department (HOSPITAL_COMMUNITY)
Admission: EM | Admit: 2017-05-30 | Discharge: 2017-05-30 | Disposition: A | Discharge status: Home / Self Care | Payer: Commercial Managed Care - HMO | Attending: Emergency Medicine | Admitting: Emergency Medicine

## 2017-05-30 ENCOUNTER — Encounter (HOSPITAL_COMMUNITY): Payer: Self-pay | Admitting: Nurse Practitioner

## 2017-05-30 DIAGNOSIS — Z5321 Procedure and treatment not carried out due to patient leaving prior to being seen by health care provider: Secondary | ICD-10-CM | POA: Diagnosis not present

## 2017-05-30 DIAGNOSIS — R109 Unspecified abdominal pain: Secondary | ICD-10-CM | POA: Insufficient documentation

## 2017-05-30 DIAGNOSIS — R197 Diarrhea, unspecified: Secondary | ICD-10-CM | POA: Insufficient documentation

## 2017-05-30 DIAGNOSIS — R112 Nausea with vomiting, unspecified: Secondary | ICD-10-CM | POA: Diagnosis present

## 2017-05-30 LAB — COMPREHENSIVE METABOLIC PANEL
ALBUMIN: 4.7 g/dL (ref 3.5–5.0)
ALK PHOS: 43 U/L (ref 38–126)
ALT: 22 U/L (ref 14–54)
AST: 24 U/L (ref 15–41)
Anion gap: 10 (ref 5–15)
BUN: 10 mg/dL (ref 6–20)
CALCIUM: 9.7 mg/dL (ref 8.9–10.3)
CO2: 25 mmol/L (ref 22–32)
CREATININE: 0.7 mg/dL (ref 0.44–1.00)
Chloride: 103 mmol/L (ref 101–111)
GFR calc Af Amer: >60 mL/min (ref >60)
GFR calc non Af Amer: >60 mL/min (ref >60)
GLUCOSE: 105 mg/dL — AB (ref 65–99)
Potassium: 3.2 mmol/L — ABNORMAL LOW (ref 3.5–5.1)
SODIUM: 138 mmol/L (ref 135–145)
Total Bilirubin: 1 mg/dL (ref 0.3–1.2)
Total Protein: 8.6 g/dL — ABNORMAL HIGH (ref 6.5–8.1)

## 2017-05-30 LAB — CBC
HCT: 39 % (ref 36.0–46.0)
Hemoglobin: 14.1 g/dL (ref 12.0–15.0)
MCH: 32.1 pg (ref 26.0–34.0)
MCHC: 36.2 g/dL — AB (ref 30.0–36.0)
MCV: 88.8 fL (ref 78.0–100.0)
PLATELETS: 210 10*3/uL (ref 150–400)
RBC: 4.39 MIL/uL (ref 3.87–5.11)
RDW: 12.1 % (ref 11.5–15.5)
WBC: 7.3 10*3/uL (ref 4.0–10.5)

## 2017-05-30 LAB — LIPASE, BLOOD: Lipase: 27 U/L (ref 11–51)

## 2017-05-30 NOTE — ED Notes (Signed)
Pt called from lobby with no resonse

## 2017-05-30 NOTE — ED Triage Notes (Signed)
Pt is c/o non-stop nausea and vomiting that has been ongoing for the last 3 days. Evaluated 2 days ago without getting relief.

## 2017-05-30 NOTE — ED Notes (Signed)
Pt called from lobby with no response x2 

## 2017-05-30 NOTE — ED Notes (Signed)
Pt had drawn for labs:  [employer]. Green Dark green x2

## 2017-05-31 ENCOUNTER — Encounter (HOSPITAL_BASED_OUTPATIENT_CLINIC_OR_DEPARTMENT_OTHER): Payer: Self-pay | Admitting: Emergency Medicine

## 2017-05-31 ENCOUNTER — Emergency Department (HOSPITAL_BASED_OUTPATIENT_CLINIC_OR_DEPARTMENT_OTHER)
Admission: EM | Admit: 2017-05-31 | Discharge: 2017-05-31 | Disposition: A | Discharge status: Home / Self Care | Payer: Commercial Managed Care - HMO | Attending: Emergency Medicine | Admitting: Emergency Medicine

## 2017-05-31 DIAGNOSIS — Z79899 Other long term (current) drug therapy: Secondary | ICD-10-CM | POA: Insufficient documentation

## 2017-05-31 DIAGNOSIS — F1210 Cannabis abuse, uncomplicated: Secondary | ICD-10-CM | POA: Insufficient documentation

## 2017-05-31 DIAGNOSIS — F17200 Nicotine dependence, unspecified, uncomplicated: Secondary | ICD-10-CM | POA: Insufficient documentation

## 2017-05-31 DIAGNOSIS — R109 Unspecified abdominal pain: Secondary | ICD-10-CM | POA: Diagnosis not present

## 2017-05-31 DIAGNOSIS — R42 Dizziness and giddiness: Secondary | ICD-10-CM | POA: Diagnosis not present

## 2017-05-31 DIAGNOSIS — R112 Nausea with vomiting, unspecified: Secondary | ICD-10-CM | POA: Insufficient documentation

## 2017-05-31 DIAGNOSIS — R55 Syncope and collapse: Secondary | ICD-10-CM | POA: Diagnosis not present

## 2017-05-31 LAB — URINALYSIS, ROUTINE W REFLEX MICROSCOPIC
BILIRUBIN URINE: NEGATIVE
GLUCOSE, UA: NEGATIVE mg/dL
Ketones, ur: 40 mg/dL — AB
Leukocytes, UA: NEGATIVE
Nitrite: NEGATIVE
Protein, ur: NEGATIVE mg/dL
Specific Gravity, Urine: 1.025 (ref 1.005–1.030)
pH: 6 (ref 5.0–8.0)

## 2017-05-31 LAB — RAPID URINE DRUG SCREEN, HOSP PERFORMED
AMPHETAMINES: NOT DETECTED
Barbiturates: NOT DETECTED
Benzodiazepines: POSITIVE — AB
Cocaine: NOT DETECTED
Opiates: NOT DETECTED
TETRAHYDROCANNABINOL: POSITIVE — AB

## 2017-05-31 LAB — URINALYSIS, MICROSCOPIC (REFLEX)

## 2017-05-31 LAB — PREGNANCY, URINE: PREG TEST UR: NEGATIVE

## 2017-05-31 MED ORDER — LACTATED RINGERS IV BOLUS (SEPSIS)
2000.0000 mL | Freq: Once | INTRAVENOUS | Status: AC
  Administered 2017-05-31: 2000 mL via INTRAVENOUS

## 2017-05-31 MED ORDER — PANTOPRAZOLE SODIUM 40 MG IV SOLR
40.0000 mg | Freq: Once | INTRAVENOUS | Status: AC
  Administered 2017-05-31: 40 mg via INTRAVENOUS
  Filled 2017-05-31: qty 40

## 2017-05-31 MED ORDER — PROMETHAZINE HCL 25 MG/ML IJ SOLN
25.0000 mg | Freq: Once | INTRAMUSCULAR | Status: AC
  Administered 2017-05-31: 25 mg via INTRAVENOUS
  Filled 2017-05-31: qty 1

## 2017-05-31 MED ORDER — PROMETHAZINE HCL 25 MG PO TABS: 25.0000 mg | ORAL_TABLET | Freq: Four times a day (QID) | ORAL | 0 refills | Status: DC | PRN

## 2017-05-31 NOTE — ED Notes (Signed)
Pt and friend given d/c instructions as per chart. Rx x 1. Verbalizes understanding. No questions.

## 2017-05-31 NOTE — ED Triage Notes (Addendum)
PT presents with c/o vomiting for 3 days. PT states she is unable to keep any fluids down and doesn't think can pee at this time.

## 2017-05-31 NOTE — ED Notes (Signed)
Lying supine in bed, sleepy, states, "feel about the same", and gives an improving number on the pain scale. No emesis/vomiting noted. Friend at [facility].

## 2017-05-31 NOTE — ED Notes (Signed)
Alert, NAD, calm, interactive, resps e/u, speaking in clear complete sentences, no dyspnea noted, skin W&D, c/o NV, onset 3d ago, last emesis 30 minutes ago, reports >10x in last 24 hrs, associated with abd pain, cold sweats, HA and dizziness, (denies: sob, diarrhea, fever), EDP into room at [facility]. [facility] at [facility]. Has zofran and phenergan at home. Last took zofran ~ 2-3 hrs ago.

## 2017-05-31 NOTE — ED Notes (Signed)
Pt sleeping, NAD, calm, arousable to voice, agreeable to fluid challenge, ginger ale given. Friend at [facility].

## 2017-05-31 NOTE — ED Provider Notes (Signed)
[facility] Provider Note: Lowella Dell, MD, FACEP  CSN: [id number] MRN: [id number] ARRIVAL: 05/31/17 at 0001 ROOM: MH02/MH02   CHIEF COMPLAINT  Vomiting   HISTORY OF PRESENT ILLNESS  05/31/17 12:23 AM Chelsey Hall is a 26 y.o. female with three-day history of nausea and vomiting. The nausea and vomiting have been persistent and severe. She was seen in the ED to days ago and treated with multiple antiemetics and IV fluids but the symptoms have persisted. She is having generalized abdominal wall pain most prominent in the epigastrium and radiating substernally. This pain came as a result of the vomiting. There is been no associated diarrhea. Her emesis has become bilious with streaks of blood. She has been lightheaded and had a syncopal episode yesterday. She has had increased thirst and decreased urine output. She admits to frequent marijuana use.   Past Medical History:  Diagnosis Date  . Head ache   . Mass of heart   . 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 Pericarditis     Past Surgical History:  Procedure Laterality Date  . APPENDECTOMY      Family History  Problem Relation Age of Onset  . Hypertension Mother   . CAD Father     Social History  Substance Use Topics  . Smoking status: Current Some Day Smoker  . Smokeless tobacco: Never Used  . Alcohol use No    Prior to Admission medications   Medication Sig Start Date End Date Taking? Authorizing Provider  amLODipine (NORVASC) 5 MG tablet Take 5 mg by mouth daily.     [provider]  diphenhydrAMINE (BENADRYL) 25 mg capsule Take 25 mg by mouth every 6 (six) hours as needed.    [provider]  fluticasone (FLONASE) 50 MCG/ACT nasal spray Place 2 sprays into both nostrils daily. 11/18/16   Loren Racer, MD  lisinopril (PRINIVIL,ZESTRIL) 20 MG tablet Take 20 mg by mouth daily.    [provider]  omeprazole (PRILOSEC) 20 MG capsule Take 1 capsule (20 mg total) by  mouth daily. 05/29/17   Bethel Born, PA-C  Ondansetron HCl (ZOFRAN PO) Take by mouth.    [provider]  promethazine (PHENERGAN) 25 MG tablet Take 1 tablet (25 mg total) by mouth every 6 (six) hours as needed for nausea or vomiting. 05/29/17   Bethel Born, PA-C    Allergies Codeine   REVIEW OF SYSTEMS  Negative except as noted here or in the History of Present Illness.   PHYSICAL EXAMINATION  Initial Vital Signs Blood pressure (!) 148/119, pulse 98, temperature 98.9 F (37.2 C), temperature source Oral, resp. rate 16, height  (1.727 m), weight 78.9 kg (174 lb), last menstrual period 05/01/2017, SpO2 100 %.  Examination General: Well-developed, well-nourished female in no acute distress; appearance consistent with age of record HENT: normocephalic; atraumatic Eyes: pupils equal, round and reactive to light; extraocular muscles intact Neck: supple Heart: regular rate and rhythm Lungs: clear to auscultation bilaterally Abdomen: soft; nondistended; epigastric tenderness; no masses or hepatosplenomegaly; bowel sounds present Extremities: No deformity; full range of motion; pulses normal Neurologic: Awake, alert and oriented; motor function intact in all extremities and symmetric; no facial droop Skin: Warm and dry Psychiatric: Flat affect   RESULTS  Summary of this visit's results, reviewed by myself:   EKG Interpretation  Date/Time:    Ventricular Rate:    PR Interval:    QRS Duration:   QT Interval:    QTC  Calculation:   R Axis:     Text Interpretation:        Laboratory Studies: Results for orders placed or performed during the hospital encounter of 05/31/17 (from the past 24 hour(s))  Pregnancy, urine     Status: None   Collection Time: 05/31/17 12:10 AM  Result Value Ref Range   Preg Test, Ur NEGATIVE NEGATIVE  Urinalysis, Routine w reflex microscopic     Status: Abnormal   Collection Time: 05/31/17 12:10 AM  Result Value Ref Range     Color, Urine YELLOW YELLOW   APPearance HAZY (A) CLEAR   Specific Gravity, Urine 1.025 1.005 - 1.030   pH 6.0 5.0 - 8.0   Glucose, UA NEGATIVE NEGATIVE mg/dL   Hgb urine dipstick TRACE (A) NEGATIVE   Bilirubin Urine NEGATIVE NEGATIVE   Ketones, ur 40 (A) NEGATIVE mg/dL   Protein, ur NEGATIVE NEGATIVE mg/dL   Nitrite NEGATIVE NEGATIVE   Leukocytes, UA NEGATIVE NEGATIVE  Rapid urine drug screen (hospital performed)     Status: Abnormal   Collection Time: 05/31/17 12:10 AM  Result Value Ref Range   Opiates NONE DETECTED NONE DETECTED   Cocaine NONE DETECTED NONE DETECTED   Benzodiazepines POSITIVE (A) NONE DETECTED   Amphetamines NONE DETECTED NONE DETECTED   Tetrahydrocannabinol POSITIVE (A) NONE DETECTED   Barbiturates NONE DETECTED NONE DETECTED  Urinalysis, Microscopic (reflex)     Status: Abnormal   Collection Time: 05/31/17 12:10 AM  Result Value Ref Range   RBC / HPF 0-5 0 - 5 RBC/hpf   WBC, UA 0-5 0 - 5 WBC/hpf   Bacteria, UA FEW (A) NONE SEEN   Squamous Epithelial / LPF 0-5 (A) NONE SEEN   Mucus PRESENT    Urine-Other MICROSCOPIC EXAM PERFORMED ON UNCONCENTRATED URINE    Imaging Studies: Dg Chest Portable 1 View  Result Date: 05/29/2017 CLINICAL DATA:  Chest pain EXAM: PORTABLE CHEST 1 VIEW COMPARISON:  11/18/2016 FINDINGS: The heart size and mediastinal contours are within normal limits. Both lungs are clear. The visualized skeletal structures are unremarkable. IMPRESSION: No active disease. Electronically Signed   By: Alcide Clever M.D.   On: 05/29/2017 07:24    ED COURSE  Nursing notes and initial vitals signs, including pulse oximetry, reviewed.  Vitals:   05/31/17 0013  BP: (!) 148/119  Pulse: 98  Resp: 16  Temp: 98.9 F (37.2 C)  TempSrc: Oral  SpO2: 100%  Weight: 78.9 kg (174 lb)  Height:  (1.727 m)   3:00 AM Feeling better. Drinking fluids without emesis. Ready to go home. Advised about cannabis abuse and possibility of this being cannabis  associated hyperemesis syndrome.  PROCEDURES    ED DIAGNOSES     ICD-10-CM   1. Nausea and vomiting in adult R11.2        Elishah Ashmore, Jonny Ruiz, MD 05/31/17 0981

## 2017-07-18 ENCOUNTER — Emergency Department (HOSPITAL_COMMUNITY)
Admission: EM | Admit: 2017-07-18 | Discharge: 2017-07-19 | Disposition: A | Discharge status: Home / Self Care | Payer: 59 | Attending: Emergency Medicine | Admitting: Emergency Medicine

## 2017-07-18 ENCOUNTER — Encounter (HOSPITAL_COMMUNITY): Payer: Self-pay | Admitting: Emergency Medicine

## 2017-07-18 ENCOUNTER — Emergency Department (HOSPITAL_COMMUNITY): Payer: 59

## 2017-07-18 ENCOUNTER — Other Ambulatory Visit: Payer: Self-pay

## 2017-07-18 DIAGNOSIS — F17200 Nicotine dependence, unspecified, uncomplicated: Secondary | ICD-10-CM | POA: Insufficient documentation

## 2017-07-18 DIAGNOSIS — R112 Nausea with vomiting, unspecified: Secondary | ICD-10-CM

## 2017-07-18 DIAGNOSIS — R1013 Epigastric pain: Secondary | ICD-10-CM | POA: Insufficient documentation

## 2017-07-18 DIAGNOSIS — R52 Pain, unspecified: Secondary | ICD-10-CM

## 2017-07-18 DIAGNOSIS — Z79899 Other long term (current) drug therapy: Secondary | ICD-10-CM | POA: Diagnosis not present

## 2017-07-18 DIAGNOSIS — R079 Chest pain, unspecified: Secondary | ICD-10-CM | POA: Diagnosis present

## 2017-07-18 DIAGNOSIS — I10 Essential (primary) hypertension: Secondary | ICD-10-CM | POA: Diagnosis not present

## 2017-07-18 DIAGNOSIS — R1110 Vomiting, unspecified: Secondary | ICD-10-CM | POA: Diagnosis not present

## 2017-07-18 HISTORY — DX: Essential (primary) hypertension: I10

## 2017-07-18 LAB — RAPID URINE DRUG SCREEN, HOSP PERFORMED
AMPHETAMINES: NOT DETECTED
BARBITURATES: NOT DETECTED
Benzodiazepines: NOT DETECTED
COCAINE: NOT DETECTED
OPIATES: NOT DETECTED
TETRAHYDROCANNABINOL: POSITIVE — AB

## 2017-07-18 LAB — CBC
HCT: 39.3 % (ref 36.0–46.0)
HEMOGLOBIN: 14 g/dL (ref 12.0–15.0)
MCH: 32.4 pg (ref 26.0–34.0)
MCHC: 35.6 g/dL (ref 30.0–36.0)
MCV: 91 fL (ref 78.0–100.0)
PLATELETS: 260 10*3/uL (ref 150–400)
RBC: 4.32 MIL/uL (ref 3.87–5.11)
RDW: 12 % (ref 11.5–15.5)
WBC: 6.3 10*3/uL (ref 4.0–10.5)

## 2017-07-18 LAB — URINALYSIS, ROUTINE W REFLEX MICROSCOPIC
BILIRUBIN URINE: NEGATIVE
Glucose, UA: NEGATIVE mg/dL
Hgb urine dipstick: NEGATIVE
KETONES UR: 20 mg/dL — AB
LEUKOCYTES UA: NEGATIVE
NITRITE: NEGATIVE
PROTEIN: NEGATIVE mg/dL
Specific Gravity, Urine: 1.016 (ref 1.005–1.030)
pH: 9 — ABNORMAL HIGH (ref 5.0–8.0)

## 2017-07-18 LAB — COMPREHENSIVE METABOLIC PANEL
ALK PHOS: 52 U/L (ref 38–126)
ALT: 19 U/L (ref 14–54)
ANION GAP: 12 (ref 5–15)
AST: 25 U/L (ref 15–41)
Albumin: 4.5 g/dL (ref 3.5–5.0)
BILIRUBIN TOTAL: 0.6 mg/dL (ref 0.3–1.2)
BUN: 8 mg/dL (ref 6–20)
CALCIUM: 9.5 mg/dL (ref 8.9–10.3)
CO2: 23 mmol/L (ref 22–32)
CREATININE: 0.86 mg/dL (ref 0.44–1.00)
Chloride: 104 mmol/L (ref 101–111)
GFR calc non Af Amer: >60 mL/min (ref >60)
Glucose, Bld: 127 mg/dL — ABNORMAL HIGH (ref 65–99)
Potassium: 3.4 mmol/L — ABNORMAL LOW (ref 3.5–5.1)
Sodium: 139 mmol/L (ref 135–145)
TOTAL PROTEIN: 7.7 g/dL (ref 6.5–8.1)

## 2017-07-18 LAB — I-STAT TROPONIN, ED: TROPONIN I, POC: 0.03 ng/mL (ref 0.00–0.08)

## 2017-07-18 LAB — LIPASE, BLOOD: Lipase: 26 U/L (ref 11–51)

## 2017-07-18 MED ORDER — SODIUM CHLORIDE 0.9 % IV BOLUS (SEPSIS)
2000.0000 mL | Freq: Once | INTRAVENOUS | Status: AC
  Administered 2017-07-18: 2000 mL via INTRAVENOUS

## 2017-07-18 MED ORDER — SODIUM CHLORIDE 0.9 % IV SOLN: INTRAVENOUS | Status: DC

## 2017-07-18 MED ORDER — METOCLOPRAMIDE HCL 5 MG/ML IJ SOLN
5.0000 mg | Freq: Once | INTRAMUSCULAR | Status: AC
  Administered 2017-07-18: 5 mg via INTRAVENOUS
  Filled 2017-07-18: qty 2

## 2017-07-18 MED ORDER — LORAZEPAM 2 MG/ML IJ SOLN
1.0000 mg | Freq: Once | INTRAMUSCULAR | Status: AC
  Administered 2017-07-18: 1 mg via INTRAVENOUS
  Filled 2017-07-18: qty 1

## 2017-07-18 MED ORDER — SODIUM CHLORIDE 0.9 % IV BOLUS (SEPSIS)
1000.0000 mL | Freq: Once | INTRAVENOUS | Status: AC
  Administered 2017-07-18: 1000 mL via INTRAVENOUS

## 2017-07-18 MED ORDER — KETOROLAC TROMETHAMINE 30 MG/ML IJ SOLN
15.0000 mg | Freq: Once | INTRAMUSCULAR | Status: AC
  Administered 2017-07-18: 15 mg via INTRAVENOUS
  Filled 2017-07-18: qty 1

## 2017-07-18 MED ORDER — DIPHENHYDRAMINE HCL 50 MG/ML IJ SOLN
12.5000 mg | Freq: Once | INTRAMUSCULAR | Status: AC
  Administered 2017-07-18: 12.5 mg via INTRAVENOUS
  Filled 2017-07-18: qty 1

## 2017-07-18 NOTE — ED Provider Notes (Signed)
[facility]   CSN: [id number] Arrival date & time: 07/18/17  2050     History   Chief Complaint Chief Complaint  Patient presents with  . Chest Pain    HPI Chelsey Hall is a 26 y.o. female.  26 year old female presents with sharp reproducible chest pain that began after she had an argument with her family members at home this evening.  No anginal-like qualities.  No suicidal homicidal ideations.  Became so upset she says that she started having emesis and now has some epigastric discomfort.  Denies any fever or chills.  Denies any current use of alcohol or marijuana.  Symptoms have gradually improved.  Called EMS and was given Zofran 4 mg but no nitroglycerin and aspirin.  He was transported here for further evaluation      Past Medical History:  Diagnosis Date  . Head ache   . Hypertension   . Mass of heart   . 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 Pericarditis     There are no active problems to display for this patient.   Past Surgical History:  Procedure Laterality Date  . APPENDECTOMY      OB History    No data available       Home Medications    Prior to Admission medications   Medication Sig Start Date End Date Taking? Authorizing Provider  amLODipine (NORVASC) 5 MG tablet Take 5 mg by mouth daily.     [provider]  diphenhydrAMINE (BENADRYL) 25 mg capsule Take 25 mg by mouth every 6 (six) hours as needed.    [provider]  fluticasone (FLONASE) 50 MCG/ACT nasal spray Place 2 sprays into both nostrils daily. 11/18/16   Loren RacerYelverton, David, MD  lisinopril (PRINIVIL,ZESTRIL) 20 MG tablet Take 20 mg by mouth daily.    [provider]  omeprazole (PRILOSEC) 20 MG capsule Take 1 capsule (20 mg total) by mouth daily. 05/29/17   Bethel BornGekas, Kelly Marie, PA-C  Ondansetron HCl (ZOFRAN PO) Take by mouth.    [provider]  promethazine (PHENERGAN) 25 MG tablet Take 1  tablet (25 mg total) by mouth every 6 (six) hours as needed for nausea or vomiting. 05/31/17   Molpus, John, MD    Family History Family History  Problem Relation Age of Onset  . Hypertension Mother   . CAD Father     Social History Social History   Tobacco Use  . Smoking status: Current Some Day Smoker  . Smokeless tobacco: Never Used  Substance Use Topics  . Alcohol use: No  . Drug use: No     Allergies   Codeine   Review of Systems Review of Systems  All other systems reviewed and are negative.    Physical Exam Updated Vital Signs BP (!) 155/104   Pulse 75   Resp 20   Ht 1.727 m (5\' 8" )   Wt 77.1 kg (170 lb)   SpO2 100%   BMI 25.85 kg/m   Physical Exam  Constitutional: She is oriented to person, place, and time. She appears well-developed and well-nourished.  Non-toxic appearance. No distress.  HENT:  Head: Normocephalic and atraumatic.  Eyes: Conjunctivae, EOM and lids are normal. Pupils are equal, round, and reactive to light.  Neck: Normal range of motion. Neck supple. No tracheal deviation present. No thyroid mass present.  Cardiovascular: Normal rate, regular rhythm and normal heart sounds. Exam reveals no gallop.  No murmur heard. Pulmonary/Chest:  Effort normal and breath sounds normal. No stridor. No respiratory distress. She has no decreased breath sounds. She has no wheezes. She has no rhonchi. She has no rales.  Abdominal: Soft. Normal appearance and bowel sounds are normal. She exhibits no distension. There is tenderness in the epigastric area. There is no rigidity, no rebound, no guarding and no CVA tenderness.    Musculoskeletal: Normal range of motion. She exhibits no edema or tenderness.  Neurological: She is alert and oriented to person, place, and time. She has normal strength. No cranial nerve deficit or sensory deficit. GCS eye subscore is 4. GCS verbal subscore is 5. GCS motor subscore is 6.  Skin: Skin is warm and dry. No abrasion and no  rash noted.  Psychiatric: Her speech is normal and behavior is normal. Her affect is blunt.  Nursing note and vitals reviewed.    ED Treatments / Results  Labs (all labs ordered are listed, but only abnormal results are displayed) Labs Reviewed  CBC  COMPREHENSIVE METABOLIC PANEL  LIPASE, BLOOD  RAPID URINE DRUG SCREEN, HOSP PERFORMED  URINALYSIS, ROUTINE W REFLEX MICROSCOPIC    EKG  EKG Interpretation  Date/Time:  Saturday July 18 2017 20:54:18 EST Ventricular Rate:  90 PR Interval:  140 QRS Duration: 70 QT Interval:  404 QTC Calculation: 494 R Axis:   -56 Text Interpretation:  Normal sinus rhythm Possible Left atrial enlargement Left anterior fascicular block Septal infarct , age undetermined Abnormal ECG Confirmed by Lorre NickAllen, Tenecia Ignasiak ([id number]) on 07/18/2017 9:06:14 PM       Radiology No results found.  Procedures Procedures (including critical care time)  Medications Ordered in ED Medications  0.9 %  sodium chloride infusion (not administered)  LORazepam (ATIVAN) injection 1 mg (not administered)  ketorolac (TORADOL) 30 MG/ML injection 15 mg (not administered)  metoCLOPramide (REGLAN) injection 5 mg (not administered)  0.9 %  sodium chloride infusion (not administered)  sodium chloride 0.9 % bolus 2,000 mL (not administered)  sodium chloride 0.9 % bolus 1,000 mL (1,000 mLs Intravenous New Bag/Given 07/18/17 2124)     Initial Impression / Assessment and Plan / ED Course  I have reviewed the triage vital signs and the nursing notes.  Pertinent labs & imaging results that were available during my care of the patient were reviewed by me and considered in my medical decision making (see chart for details).    10:47 PM  Patient treated with IV fluids and antiemetics here as well as anti-inflammatories.  Continues to have emesis at this time.  Repeat abdominal exam shows diffuse tenderness.  Will order abdominal CT.  Patient to be signs next provider Final  Clinical Impressions(s) / ED Diagnoses   Final diagnoses:  None    ED Discharge Orders    None       Lorre NickAllen, Verne Lanuza, MD 07/18/17 2247

## 2017-07-18 NOTE — ED Triage Notes (Signed)
Pt BIB [facility] for chest pain. Started 1830 described as a tightness. Pt was diaophoretic and having N/V. Pt was given 4mg  IV zofran but no ASA or NItro because of vomiting. Pt had 2 episodes of vomiting with EMS.

## 2017-07-18 NOTE — ED Triage Notes (Signed)
Pt did not get ASA or Nitro due to vomiting

## 2017-07-19 ENCOUNTER — Emergency Department (HOSPITAL_COMMUNITY): Payer: 59

## 2017-07-19 LAB — POC URINE PREG, ED: Preg Test, Ur: NEGATIVE

## 2017-07-19 MED ORDER — IOPAMIDOL (ISOVUE-300) INJECTION 61%
INTRAVENOUS | Status: AC
  Administered 2017-07-19: 100 mL
  Filled 2017-07-19: qty 100

## 2017-07-19 NOTE — ED Provider Notes (Signed)
Care assumed from Dr. Freida BusmanAllen.  Patient awaiting results of a CT scan.  This returned negative for any acute intra-abdominal pathology.  She does have an ovarian cyst which does not appear to be the cause of her discomfort.  Her pain and tenderness is r in the epigastric region.  I see no indication for further workup at this time.  She is to follow-up with her primary doctor.   Geoffery Lyonselo, Ronny Ruddell, MD 07/19/17 [id number]

## 2017-07-19 NOTE — ED Notes (Signed)
Patient transported to CT 

## 2017-07-19 NOTE — Discharge Instructions (Signed)
Follow-up with your primary doctor if symptoms are not improving or persist.

## 2017-12-21 ENCOUNTER — Emergency Department (HOSPITAL_BASED_OUTPATIENT_CLINIC_OR_DEPARTMENT_OTHER)
Admission: EM | Admit: 2017-12-21 | Discharge: 2017-12-21 | Disposition: A | Discharge status: Home / Self Care | Payer: 59 | Attending: Emergency Medicine | Admitting: Emergency Medicine

## 2017-12-21 ENCOUNTER — Emergency Department (HOSPITAL_BASED_OUTPATIENT_CLINIC_OR_DEPARTMENT_OTHER): Payer: 59

## 2017-12-21 ENCOUNTER — Other Ambulatory Visit: Payer: Self-pay

## 2017-12-21 ENCOUNTER — Encounter (HOSPITAL_BASED_OUTPATIENT_CLINIC_OR_DEPARTMENT_OTHER): Payer: Self-pay | Admitting: Emergency Medicine

## 2017-12-21 DIAGNOSIS — M79644 Pain in right finger(s): Secondary | ICD-10-CM | POA: Diagnosis present

## 2017-12-21 DIAGNOSIS — Z79899 Other long term (current) drug therapy: Secondary | ICD-10-CM | POA: Insufficient documentation

## 2017-12-21 DIAGNOSIS — L03011 Cellulitis of right finger: Secondary | ICD-10-CM | POA: Diagnosis not present

## 2017-12-21 DIAGNOSIS — F17200 Nicotine dependence, unspecified, uncomplicated: Secondary | ICD-10-CM | POA: Insufficient documentation

## 2017-12-21 DIAGNOSIS — I10 Essential (primary) hypertension: Secondary | ICD-10-CM | POA: Diagnosis not present

## 2017-12-21 MED ORDER — SODIUM CHLORIDE 0.9 % IV BOLUS: 1000.0000 mL | Freq: Once | INTRAVENOUS | Status: DC

## 2017-12-21 MED ORDER — LIDOCAINE HCL (PF) 1 % IJ SOLN
INTRAMUSCULAR | Status: AC
  Filled 2017-12-21: qty 5

## 2017-12-21 MED ORDER — LIDOCAINE HCL (PF) 1 % IJ SOLN
10.0000 mL | Freq: Once | INTRAMUSCULAR | Status: AC
  Administered 2017-12-21: 10 mL
  Filled 2017-12-21: qty 10

## 2017-12-21 MED ORDER — HYDROCODONE-ACETAMINOPHEN 5-325 MG PO TABS
1.0000 | ORAL_TABLET | Freq: Once | ORAL | Status: AC
  Administered 2017-12-21: 1 via ORAL
  Filled 2017-12-21: qty 1

## 2017-12-21 MED ORDER — DOXYCYCLINE HYCLATE 100 MG PO CAPS: 100.0000 mg | ORAL_CAPSULE | Freq: Two times a day (BID) | ORAL | 0 refills | Status: DC

## 2017-12-21 NOTE — ED Provider Notes (Signed)
[facility] EMERGENCY DEPARTMENT Provider Note   CSN: [id number] Arrival date & time: 12/21/17  1132     History   Chief Complaint Chief Complaint  Patient presents with  . Hand Pain  . Arm Pain    HPI Chelsey Hall is a 27 y.o. female.  Chelsey Hall is a 27 y.o. Female hypertension, pericarditis, and pancreatitis, who presents to the ED for evaluation of pain and swelling of the right thumb with pain that radiates up the arm. Pt reports she had a hang nail on the thumb that she bit off, and then developed pain and swelling around the nail and pain sometimes radaites up her arm and today she noticed some swelling in her armpit. Pt reports associated chills, but no fevers, malaise, nausea or vomiting. Pain is worse with movement of finger or palpation. No numbness or tingling. Tetanus is up to date. No hx of similar in the past.     Past Medical History:  Diagnosis Date  . Head ache   . Hypertension   . Mass of heart   . 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 Pericarditis     There are no active problems to display for this patient.   Past Surgical History:  Procedure Laterality Date  . APPENDECTOMY       OB History   None      Home Medications    Prior to Admission medications   Medication Sig Start Date End Date Taking? Authorizing Provider  amLODipine (NORVASC) 5 MG tablet Take 5 mg by mouth daily.     [provider]  diphenhydrAMINE (BENADRYL) 25 mg capsule Take 25 mg by mouth every 6 (six) hours as needed.    [provider]  fluticasone (FLONASE) 50 MCG/ACT nasal spray Place 2 sprays into both nostrils daily. 11/18/16   Loren Racer, MD  lisinopril (PRINIVIL,ZESTRIL) 20 MG tablet Take 20 mg by mouth daily.    [provider]  lisinopril-hydrochlorothiazide (PRINZIDE,ZESTORETIC) 20-12.5 MG tablet Take 1 tablet by mouth daily. 08/29/16   [provider]  omeprazole (PRILOSEC) 20 MG capsule Take 1  capsule (20 mg total) by mouth daily. 05/29/17   Bethel Born, PA-C  Ondansetron HCl (ZOFRAN PO) Take by mouth.    [provider]  promethazine (PHENERGAN) 25 MG tablet Take 1 tablet (25 mg total) by mouth every 6 (six) hours as needed for nausea or vomiting. 05/31/17   Molpus, John, MD    Family History Family History  Problem Relation Age of Onset  . Hypertension Mother   . CAD Father     Social History Social History   Tobacco Use  . Smoking status: Current Some Day Smoker  . Smokeless tobacco: Never Used  Substance Use Topics  . Alcohol use: No  . Drug use: No    Types: Marijuana     Allergies   Codeine   Review of Systems Review of Systems  Constitutional: Positive for chills. Negative for fever.  Eyes: Negative for visual disturbance.  Respiratory: Negative for shortness of breath.   Cardiovascular: Negative for chest pain.  Gastrointestinal: Negative for nausea and vomiting.  Musculoskeletal: Positive for arthralgias, joint swelling and myalgias.  Skin: Positive for color change. Negative for wound.  Neurological: Negative for dizziness and light-headedness.     Physical Exam Updated Vital Signs BP (!) 153/112 (BP Location: Right Arm)   Pulse 91   Temp 99.2 F (37.3 C) (Oral)   Resp 16  LMP 12/09/2017   SpO2 98%   Physical Exam  Constitutional: She appears well-developed and well-nourished. No distress.  HENT:  Head: Normocephalic and atraumatic.  Eyes: Right eye exhibits no discharge. Left eye exhibits no discharge.  Pulmonary/Chest: Effort normal. No respiratory distress.  Musculoskeletal:  Erythema and swelling of distal segment of right thumb, surrounding nailbed, the tip of the finger is still soft, pt can bend thumb although this is limited by pain, swelling does not extend beyond the distal segment, al the entire thumb is tender. Pt reports pain extends up arm but the remainder of hand and arm are NTTP, there is some right  axillary lymphadenopathy. 2+ radial pulse, normal sensation and strength  Neurological: She is alert. Coordination normal.  Skin: She is not diaphoretic.  Psychiatric: She has a normal mood and affect. Her behavior is normal.  Nursing note and vitals reviewed.        ED Treatments / Results  Labs (all labs ordered are listed, but only abnormal results are displayed) Labs Reviewed - No data to display  EKG None  Radiology No results found.  Procedures .Marland KitchenIncision and Drainage Date/Time: 12/21/2017 2:40 PM Performed by: Dartha Lodge, PA-C Authorized by: Dartha Lodge, PA-C   Consent:    Consent obtained:  Verbal   Consent given by:  Patient   Risks discussed:  Bleeding, incomplete drainage, infection and pain   Alternatives discussed:  No treatment Location:    Type:  Abscess   Location:  Upper extremity   Upper extremity location:  Finger   Finger location:  R thumb Pre-procedure details:    Skin preparation:  Chloraprep Anesthesia (see MAR for exact dosages):    Anesthesia method:  Nerve block   Block location:  Digital   Block needle gauge:  25 G   Block anesthetic:  Lidocaine 1% w/o epi   Block injection procedure:  Anatomic landmarks identified, introduced needle and incremental injection   Block outcome:  Anesthesia achieved Procedure type:    Complexity:  Simple Procedure details:    Incision types:  Single straight   Incision depth:  Dermal   Scalpel blade:  10   Wound management:  Probed and deloculated and irrigated with saline   Drainage:  Bloody and purulent   Drainage amount:  Moderate   Wound treatment:  Wound left open   Packing materials:  None Post-procedure details:    Patient tolerance of procedure:  Tolerated well, no immediate complications Comments:     Drainage of paronychia of the right thumb, assisted by Dr. Patria Mane who also attempted small incision to the finger pad of the thumb for continued drainage   (including critical care  time)  Medications Ordered in ED Medications  lidocaine (PF) (XYLOCAINE) 1 % injection 10 mL (10 mLs Infiltration Given by Other 12/21/17 1309)  HYDROcodone-acetaminophen (NORCO/VICODIN) 5-325 MG per tablet 1 tablet (1 tablet Oral Given 12/21/17 1310)     Initial Impression / Assessment and Plan / ED Course  I have reviewed the triage vital signs and the nursing notes.  Pertinent labs & imaging results that were available during my care of the patient were reviewed by me and considered in my medical decision making (see chart for details).  Pt presents for pain and swelling of her right thumb, primarily surrounding the nailbed. Pain radiates up the arm and there is some axillary lymphadenopathy, but no swelling of the arm or lymphangitic streaking. Pt with mild hypertension, vitals otherwise normal, and pt  in NAD. Exam concerning for extensive paronychia, with some swelling over the lower portion of the finger bad, but this does not seem to have progressed all the way to a felon, exam is not concerning for flexor tenosynovitis. Tetanus is up to date. Will plan for I&D. Pt discussed with Dr. Patria Mane, who saw and evaluated the pt and is in agreement.  Pt anesthetized with digital block. I&D of paronychia produced a moderate amount of purulent drainage, but pt still had a decent amount of swelling. Dr. Patria Mane attempted small incision of the finger pad, which did not produce additional drainage. Will place patient on doxycycline, encourage soaks to promote drainage and have pt return in 48 hours for wound check.  Return precautions discussed. Pt expresses understanding and is in agreement with plan.  Final Clinical Impressions(s) / ED Diagnoses   Final diagnoses:  Paronychia of right thumb    ED Discharge Orders        Ordered    doxycycline (VIBRAMYCIN) 100 MG capsule  2 times daily     12/21/17 1436       Legrand Rams 12/22/17 1221    Azalia Bilis, MD 12/22/17 1451

## 2017-12-21 NOTE — Discharge Instructions (Addendum)
Please return in 48 hours for wound check.  You will need to take doxycycline twice daily for the next 7 days.  Keep area clean and dry, change the dressing at least twice a day, you can do warm soaks and clean water to help promote drainage.  If you have worsening pain or swelling, fevers, nausea vomiting or other new or concerning symptoms you can return sooner for reevaluation.

## 2017-12-21 NOTE — ED Triage Notes (Signed)
Rt arm and hand pain with , first started in thumb now  Has moved to whole arm and her armpit is swollen now, hurts to move and  Thumb is swollen , states  Bit off hang nail on thumb last week but no other injury to arm  Or hand , thumb is very tender to touch

## 2017-12-25 LAB — AEROBIC CULTURE W GRAM STAIN (SUPERFICIAL SPECIMEN)

## 2017-12-25 LAB — AEROBIC CULTURE  (SUPERFICIAL SPECIMEN)

## 2017-12-26 ENCOUNTER — Telehealth: Payer: Self-pay

## 2017-12-26 NOTE — Telephone Encounter (Signed)
Post ED Visit - Positive Culture Follow-up  Culture report reviewed by antimicrobial stewardship pharmacist:   Enzo Bi, Pharm.D.  Celedonio Miyamoto, Pharm.D., BCPS AQ-ID  Garvin Fila, Pharm.D., BCPS  Georgina Pillion, [address].D., BCPS  [place], [address].D., BCPS, AAHIVP  Estella Husk, Pharm.D., BCPS, AAHIVP  Lysle Pearl, PharmD, BCPS  Sherlynn Carbon, PharmD  Pollyann Samples, PharmD, BCPS  Positive aerobic culture Treated with Doxycycline, organism sensitive to the same and no further patient follow-up is required at this time.  Jerry Caras 12/26/2017, 11:18 AM

## 2018-03-01 ENCOUNTER — Emergency Department (HOSPITAL_COMMUNITY)
Admission: EM | Admit: 2018-03-01 | Discharge: 2018-03-01 | Disposition: A | Discharge status: Home / Self Care | Payer: 59 | Source: Home / Self Care | Attending: Emergency Medicine | Admitting: Emergency Medicine

## 2018-03-01 ENCOUNTER — Other Ambulatory Visit: Payer: Self-pay

## 2018-03-01 ENCOUNTER — Encounter (HOSPITAL_COMMUNITY): Payer: Self-pay | Admitting: Emergency Medicine

## 2018-03-01 DIAGNOSIS — G43A0 Cyclical vomiting, not intractable: Secondary | ICD-10-CM

## 2018-03-01 DIAGNOSIS — Z79899 Other long term (current) drug therapy: Secondary | ICD-10-CM

## 2018-03-01 DIAGNOSIS — F12988 Cannabis use, unspecified with other cannabis-induced disorder: Secondary | ICD-10-CM | POA: Diagnosis not present

## 2018-03-01 DIAGNOSIS — I10 Essential (primary) hypertension: Secondary | ICD-10-CM | POA: Insufficient documentation

## 2018-03-01 DIAGNOSIS — R1110 Vomiting, unspecified: Secondary | ICD-10-CM | POA: Diagnosis present

## 2018-03-01 DIAGNOSIS — F17200 Nicotine dependence, unspecified, uncomplicated: Secondary | ICD-10-CM

## 2018-03-01 DIAGNOSIS — R1115 Cyclical vomiting syndrome unrelated to migraine: Secondary | ICD-10-CM

## 2018-03-01 LAB — CBC
HCT: 39.8 % (ref 36.0–46.0)
Hemoglobin: 13.9 g/dL (ref 12.0–15.0)
MCH: 31.8 pg (ref 26.0–34.0)
MCHC: 34.9 g/dL (ref 30.0–36.0)
MCV: 91.1 fL (ref 78.0–100.0)
Platelets: 221 10*3/uL (ref 150–400)
RBC: 4.37 MIL/uL (ref 3.87–5.11)
RDW: 12.8 % (ref 11.5–15.5)
WBC: 3.6 10*3/uL — ABNORMAL LOW (ref 4.0–10.5)

## 2018-03-01 LAB — COMPREHENSIVE METABOLIC PANEL
ALT: 31 U/L (ref 0–44)
AST: 26 U/L (ref 15–41)
Albumin: 4.2 g/dL (ref 3.5–5.0)
Alkaline Phosphatase: 45 U/L (ref 38–126)
Anion gap: 7 (ref 5–15)
BUN: 10 mg/dL (ref 6–20)
CO2: 25 mmol/L (ref 22–32)
Calcium: 9.2 mg/dL (ref 8.9–10.3)
Chloride: 110 mmol/L (ref 98–111)
Creatinine, Ser: 0.74 mg/dL (ref 0.44–1.00)
GFR calc Af Amer: >60 mL/min (ref >60)
GFR calc non Af Amer: >60 mL/min (ref >60)
Glucose, Bld: 103 mg/dL — ABNORMAL HIGH (ref 70–99)
Potassium: 3.9 mmol/L (ref 3.5–5.1)
Sodium: 142 mmol/L (ref 135–145)
Total Bilirubin: 0.6 mg/dL (ref 0.3–1.2)
Total Protein: 7.4 g/dL (ref 6.5–8.1)

## 2018-03-01 LAB — URINALYSIS, ROUTINE W REFLEX MICROSCOPIC
Bilirubin Urine: NEGATIVE
Glucose, UA: NEGATIVE mg/dL
Ketones, ur: NEGATIVE mg/dL
Leukocytes, UA: NEGATIVE
Nitrite: NEGATIVE
Protein, ur: NEGATIVE mg/dL
Specific Gravity, Urine: 1.016 (ref 1.005–1.030)
pH: 7 (ref 5.0–8.0)

## 2018-03-01 LAB — I-STAT BETA HCG BLOOD, ED (MC, WL, AP ONLY): I-stat hCG, quantitative: <5 m[IU]/mL (ref <5)

## 2018-03-01 LAB — RAPID URINE DRUG SCREEN, HOSP PERFORMED
Amphetamines: NOT DETECTED
BENZODIAZEPINES: NOT DETECTED
Cocaine: NOT DETECTED
Opiates: NOT DETECTED
Tetrahydrocannabinol: POSITIVE — AB

## 2018-03-01 LAB — LIPASE, BLOOD: Lipase: 28 U/L (ref 11–51)

## 2018-03-01 MED ORDER — PROMETHAZINE HCL 25 MG RE SUPP: 25.0000 mg | Freq: Four times a day (QID) | RECTAL | 0 refills | Status: AC | PRN

## 2018-03-01 MED ORDER — HALOPERIDOL LACTATE 5 MG/ML IJ SOLN
2.0000 mg | Freq: Once | INTRAMUSCULAR | Status: AC
  Administered 2018-03-01: 2 mg via INTRAVENOUS
  Filled 2018-03-01: qty 1

## 2018-03-01 MED ORDER — SODIUM CHLORIDE 0.9 % IV BOLUS
1000.0000 mL | Freq: Once | INTRAVENOUS | Status: AC
  Administered 2018-03-01: 1000 mL via INTRAVENOUS

## 2018-03-01 MED ORDER — CAPSAICIN 0.025 % EX CREA
TOPICAL_CREAM | Freq: Two times a day (BID) | CUTANEOUS | Status: DC
  Administered 2018-03-01: 16:00:00 via TOPICAL
  Filled 2018-03-01 (×2): qty 60

## 2018-03-01 MED ORDER — PROMETHAZINE HCL 25 MG/ML IJ SOLN
12.5000 mg | Freq: Once | INTRAMUSCULAR | Status: AC
  Administered 2018-03-01: 12.5 mg via INTRAVENOUS
  Filled 2018-03-01: qty 1

## 2018-03-01 NOTE — ED Triage Notes (Signed)
Pt complaint of continued n/v/d post treatment.

## 2018-03-01 NOTE — Discharge Instructions (Addendum)
Try capsaicin cream, rubbing on your abdomen twice a day.  Take Phenergan suppository if no relief of nausea with Zofran.  Avoid marijuana use.  Follow-up with your gastroenterologist or primary care doctor as needed

## 2018-03-01 NOTE — ED Provider Notes (Signed)
[facility]-EMERGENCY DEPT Provider Note   CSN: [id number] Arrival date & time: 03/01/18  1110     History   Chief Complaint Chief Complaint  Patient presents with  . Abdominal Pain    HPI Chelsey Hall is a 27 y.o. female.  HPI Chelsey Hall is a 27 y.o. female with hx of pancreatitis, htn, presents to ED with complaint of abdominal pain, nausea, vomiting.  Patient with chronic symptoms, states that she has seen multiple providers for the same and gastroenterologist.  Had CT scans, endoscopy,, states nobody can figure was going on with her.  She has quit drinking alcohol.  She states she has not had marijuana in 2 weeks, although she has been using marijuana daily prior to that.  She states she is frustrated.  She has had persistent nausea and vomiting over the last several days.  She states she has abdominal pain that is worse in the lower abdomen but relieves all over.  Denies any fever or chills.  No blood in her stool or emesis.    Past Medical History:  Diagnosis Date  . Head ache   . Hypertension   . Mass of heart   . 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 Pericarditis     There are no active problems to display for this patient.   Past Surgical History:  Procedure Laterality Date  . APPENDECTOMY       OB History   None      Home Medications    Prior to Admission medications   Medication Sig Start Date End Date Taking? Authorizing Provider  amLODipine (NORVASC) 5 MG tablet Take 5 mg by mouth daily.     [provider]  diphenhydrAMINE (BENADRYL) 25 mg capsule Take 25 mg by mouth every 6 (six) hours as needed.    [provider]  doxycycline (VIBRAMYCIN) 100 MG capsule Take 1 capsule (100 mg total) by mouth 2 (two) times daily. One po bid x 7 days 12/21/17   Dartha Lodge, PA-C  fluticasone [facility]) 50 MCG/ACT nasal spray Place 2 sprays into both nostrils daily. 11/18/16   Loren Racer, MD  lisinopril  (PRINIVIL,ZESTRIL) 20 MG tablet Take 20 mg by mouth daily.    [provider]  lisinopril-hydrochlorothiazide (PRINZIDE,ZESTORETIC) 20-12.5 MG tablet Take 1 tablet by mouth daily. 08/29/16   [provider]  omeprazole (PRILOSEC) 20 MG capsule Take 1 capsule (20 mg total) by mouth daily. 05/29/17   Bethel Born, PA-C  Ondansetron HCl (ZOFRAN PO) Take by mouth.    [provider]  promethazine (PHENERGAN) 25 MG tablet Take 1 tablet (25 mg total) by mouth every 6 (six) hours as needed for nausea or vomiting. 05/31/17   Molpus, John, MD    Family History Family History  Problem Relation Age of Onset  . Hypertension Mother   . CAD Father     Social History Social History   Tobacco Use  . Smoking status: Current Some Day Smoker  . Smokeless tobacco: Never Used  Substance Use Topics  . Alcohol use: No  . Drug use: No    Types: Marijuana     Allergies   Codeine   Review of Systems Review of Systems  Constitutional: Negative for chills and fever.  Respiratory: Negative for cough, chest tightness and shortness of breath.   Cardiovascular: Negative for chest pain, palpitations and leg swelling.  Gastrointestinal: Positive for abdominal pain, nausea and vomiting. Negative for diarrhea.  Genitourinary:  Negative for dysuria, flank pain, pelvic pain, vaginal bleeding, vaginal discharge and vaginal pain.  Musculoskeletal: Negative for arthralgias, myalgias, neck pain and neck stiffness.  Skin: Negative for rash.  Neurological: Positive for weakness. Negative for dizziness and headaches.  All other systems reviewed and are negative.    Physical Exam Updated Vital Signs BP (!) 160/119   Pulse 76   Temp 98.5 F (36.9 C) (Oral)   Resp 18   SpO2 100%   Physical Exam  Constitutional: She appears well-developed and well-nourished. No distress.  HENT:  Head: Normocephalic.  Eyes: Conjunctivae are normal.  Neck: Neck supple.  Cardiovascular: Normal  rate, regular rhythm and normal heart sounds.  Pulmonary/Chest: Effort normal and breath sounds normal. No respiratory distress. She has no wheezes. She has no rales.  Abdominal: Soft. Bowel sounds are normal. She exhibits no distension. There is tenderness. There is no rebound.  Diffuse tenderness  Musculoskeletal: She exhibits no edema.  Neurological: She is alert.  Skin: Skin is warm and dry.  Psychiatric: She has a normal mood and affect. Her behavior is normal.  Nursing note and vitals reviewed.    ED Treatments / Results  Labs (all labs ordered are listed, but only abnormal results are displayed) Labs Reviewed  COMPREHENSIVE METABOLIC PANEL - Abnormal; Notable for the following components:      Result Value   Glucose, Bld 103 (*)    All other components within normal limits  CBC - Abnormal; Notable for the following components:   WBC 3.6 (*)    All other components within normal limits  URINALYSIS, ROUTINE W REFLEX MICROSCOPIC - Abnormal; Notable for the following components:   Hgb urine dipstick LARGE (*)    Bacteria, UA RARE (*)    All other components within normal limits  RAPID URINE DRUG SCREEN, HOSP PERFORMED - Abnormal; Notable for the following components:   Tetrahydrocannabinol POSITIVE (*)    Barbiturates   (*)    Value: Result not available. Reagent lot number recalled by manufacturer.   All other components within normal limits  LIPASE, BLOOD  I-STAT BETA HCG BLOOD, ED ([facility], [facility], [facility] ONLY)    EKG None  Radiology No results found.  Procedures Procedures (including critical care time)  Medications Ordered in ED Medications  sodium chloride 0.9 % bolus 1,000 mL (has no administration in time range)  promethazine (PHENERGAN) injection 12.5 mg (has no administration in time range)     Initial Impression / Assessment and Plan / ED Course  I have reviewed the triage vital signs and the nursing notes.  Pertinent labs & imaging results that were available  during my care of the patient were reviewed by me and considered in my medical decision making (see chart for details).     Patient with recurrent nausea, vomiting, abdominal pain.  I reviewed patient's prior visits in care everywhere and to gastroenterologist.  Sounds like patient had an endoscopy, multiple CT scans with no acute findings.  I do not see and cannot find her endoscopy results but patient told me that it was fine.  We will check labs today, administer IV fluids and antiemetics.  Her abdomen is diffusely tender but soft.   1:25 PM Labs all within normal except white blood cell count of 3.6.  Normal electrolytes.  She is not pregnant.  Her urinalysis and drug screen pending.  2:50 PM Patient's blood work and urinalysis all unremarkable.  She received Phenergan and 2 mg of Haldol.  She states  she feels much better.  We will discharge her home with antiemetics, capsaicin cream, again encouraged to stop using marijuana.  We will have her follow-up with her gastroenterologist closely.  I will also give her prescription for Phenergan suppositories.  Vitals:   03/01/18 1143 03/01/18 1230 03/01/18 1300  BP: (!) 166/107 (!) 158/92 (!) 160/119  Pulse: 83 66 76  Resp: 18    Temp: 98.5 F (36.9 C)    TempSrc: Oral    SpO2: 100% 100% 100%    Final Clinical Impressions(s) / ED Diagnoses   Final diagnoses:  Non-intractable cyclical vomiting with nausea    ED Discharge Orders        Ordered    promethazine (PHENERGAN) 25 MG suppository  Every 6 hours PRN     03/01/18 1453       Jaynie Crumble, PA-C 03/01/18 1454    Raeford Razor, MD 03/01/18 [phone]

## 2018-03-02 ENCOUNTER — Emergency Department (HOSPITAL_COMMUNITY)
Admission: EM | Admit: 2018-03-02 | Discharge: 2018-03-02 | Disposition: A | Discharge status: Home / Self Care | Payer: 59 | Attending: Emergency Medicine | Admitting: Emergency Medicine

## 2018-03-02 ENCOUNTER — Encounter (HOSPITAL_COMMUNITY): Payer: Self-pay | Admitting: Emergency Medicine

## 2018-03-02 ENCOUNTER — Other Ambulatory Visit: Payer: Self-pay

## 2018-03-02 DIAGNOSIS — F1290 Cannabis use, unspecified, uncomplicated: Secondary | ICD-10-CM

## 2018-03-02 DIAGNOSIS — R112 Nausea with vomiting, unspecified: Secondary | ICD-10-CM

## 2018-03-02 DIAGNOSIS — F12988 Cannabis use, unspecified with other cannabis-induced disorder: Secondary | ICD-10-CM

## 2018-03-02 LAB — CBC
HCT: 40.9 % (ref 36.0–46.0)
Hemoglobin: 14.1 g/dL (ref 12.0–15.0)
MCH: 31.7 pg (ref 26.0–34.0)
MCHC: 34.5 g/dL (ref 30.0–36.0)
MCV: 91.9 fL (ref 78.0–100.0)
PLATELETS: 240 10*3/uL (ref 150–400)
RBC: 4.45 MIL/uL (ref 3.87–5.11)
RDW: 12.8 % (ref 11.5–15.5)
WBC: 5.5 10*3/uL (ref 4.0–10.5)

## 2018-03-02 LAB — COMPREHENSIVE METABOLIC PANEL
ALBUMIN: 4.7 g/dL (ref 3.5–5.0)
ALT: 29 U/L (ref 0–44)
AST: 23 U/L (ref 15–41)
Alkaline Phosphatase: 45 U/L (ref 38–126)
Anion gap: 10 (ref 5–15)
BUN: 8 mg/dL (ref 6–20)
CHLORIDE: 105 mmol/L (ref 98–111)
CO2: 26 mmol/L (ref 22–32)
CREATININE: 0.77 mg/dL (ref 0.44–1.00)
Calcium: 9.4 mg/dL (ref 8.9–10.3)
GFR calc non Af Amer: >60 mL/min (ref >60)
GLUCOSE: 135 mg/dL — AB (ref 70–99)
Potassium: 3.9 mmol/L (ref 3.5–5.1)
SODIUM: 141 mmol/L (ref 135–145)
Total Bilirubin: 0.6 mg/dL (ref 0.3–1.2)
Total Protein: 8.1 g/dL (ref 6.5–8.1)

## 2018-03-02 LAB — URINALYSIS, ROUTINE W REFLEX MICROSCOPIC
Bacteria, UA: NONE SEEN
Bilirubin Urine: NEGATIVE
GLUCOSE, UA: 50 mg/dL — AB
Ketones, ur: 80 mg/dL — AB
Leukocytes, UA: NEGATIVE
Nitrite: NEGATIVE
PH: 7 (ref 5.0–8.0)
Protein, ur: NEGATIVE mg/dL
Specific Gravity, Urine: 1.014 (ref 1.005–1.030)

## 2018-03-02 LAB — I-STAT BETA HCG BLOOD, ED (MC, WL, AP ONLY): I-stat hCG, quantitative: <5 m[IU]/mL (ref <5)

## 2018-03-02 LAB — LIPASE, BLOOD: LIPASE: 24 U/L (ref 11–51)

## 2018-03-02 MED ORDER — GI COCKTAIL ~~LOC~~
30.0000 mL | Freq: Once | ORAL | Status: AC
  Administered 2018-03-02: 30 mL via ORAL
  Filled 2018-03-02: qty 30

## 2018-03-02 MED ORDER — KETOROLAC TROMETHAMINE 30 MG/ML IJ SOLN
15.0000 mg | Freq: Once | INTRAMUSCULAR | Status: AC
  Administered 2018-03-02: 15 mg via INTRAVENOUS
  Filled 2018-03-02: qty 1

## 2018-03-02 MED ORDER — SODIUM CHLORIDE 0.9 % IV BOLUS
500.0000 mL | Freq: Once | INTRAVENOUS | Status: AC
  Administered 2018-03-02: 500 mL via INTRAVENOUS

## 2018-03-02 MED ORDER — CAPSAICIN 0.025 % EX CREA
TOPICAL_CREAM | Freq: Two times a day (BID) | CUTANEOUS | Status: DC
  Administered 2018-03-02: 06:00:00 via TOPICAL
  Filled 2018-03-02: qty 60

## 2018-03-02 MED ORDER — ONDANSETRON 4 MG PO TBDP: 4.0000 mg | ORAL_TABLET | Freq: Once | ORAL | Status: DC | PRN

## 2018-03-02 MED ORDER — HALOPERIDOL LACTATE 5 MG/ML IJ SOLN
5.0000 mg | Freq: Once | INTRAMUSCULAR | Status: AC
  Administered 2018-03-02: 5 mg via INTRAVENOUS
  Filled 2018-03-02: qty 1

## 2018-03-02 NOTE — ED Notes (Signed)
50 cc clear yellow emesis discarded, pt given new emesis bag.

## 2018-03-02 NOTE — ED Provider Notes (Signed)
[facility]-EMERGENCY DEPT Provider Note   CSN: [id number] Arrival date & time: 03/01/18  2321     History   Chief Complaint Chief Complaint  Patient presents with  . Emesis    HPI Chelsey Hall is a 27 y.o. female.   Emesis   This is a chronic problem. The current episode started more than 1 week ago. The problem occurs 5 to 10 times per day. The problem has not changed since onset.The emesis has an appearance of stomach contents. There has been no fever. Associated symptoms include abdominal pain. Pertinent negatives include no arthralgias, no chills, no cough, no diarrhea, no fever, no headaches, no myalgias, no sweats and no URI. Risk factors: marijuana abuse.  Abdominal Pain   This is a chronic problem. The current episode started more than 1 week ago. The problem occurs constantly. The problem has not changed since onset.Associated with: marijuana. The quality of the pain is cramping. The pain is at a severity of 10/10. The pain is severe. Associated symptoms include nausea and vomiting. Pertinent negatives include fever, diarrhea, dysuria, frequency, headaches, arthralgias and myalgias. Nothing aggravates the symptoms. Nothing relieves the symptoms. Past workup does not include CT scan. Her past medical history does not include ulcerative colitis or Crohn's disease.  Patient with a h/o chronic pain/ HTN and cyclic vomiting seen in the ED this past afternoon for same.  Patient with chronic symptoms, states that she has seen multiple providers for the same and gastroenterologist.  Had CT scans, endoscopy,  She states she has not had marijuana in 2 weeks, although it is still in her urine and the room smells of it.  She states she is frustrated.  She has had persistent nausea and vomiting over the last several days.   Denies any fever or chills.   Past Medical History:  Diagnosis Date  . Head ache   . Hypertension   . Mass of heart   . Pancreatitis due to  biliary obstruction   . Pericarditis     There are no active problems to display for this patient.   Past Surgical History:  Procedure Laterality Date  . APPENDECTOMY       OB History   None      Home Medications    Prior to Admission medications   Medication Sig Start Date End Date Taking? Authorizing Provider  amLODipine (NORVASC) 5 MG tablet Take 5 mg by mouth daily.    Yes [provider]  ibuprofen (ADVIL,MOTRIN) 200 MG tablet Take 400 mg by mouth every 6 (six) hours as needed for moderate pain.   Yes [provider]  lisinopril-hydrochlorothiazide (PRINZIDE,ZESTORETIC) 20-12.5 MG tablet Take 1 tablet by mouth daily. 08/29/16  Yes [provider]  metoprolol succinate (TOPROL-XL) 25 MG 24 hr tablet Take 25 mg by mouth daily.  07/22/17  Yes [provider]  omeprazole (PRILOSEC) 20 MG capsule Take 1 capsule (20 mg total) by mouth daily. 05/29/17  Yes Bethel Born, PA-C  ondansetron (ZOFRAN-ODT) 8 MG disintegrating tablet Take 8 mg by mouth every 8 (eight) hours as needed for nausea or vomiting.   Yes [provider]  promethazine (PHENERGAN) 25 MG suppository Place 1 suppository (25 mg total) rectally every 6 (six) hours as needed for nausea or vomiting. 03/01/18  Yes Kirichenko, Tatyana, PA-C  doxycycline (VIBRAMYCIN) 100 MG capsule Take 1 capsule (100 mg total) by mouth 2 (two) times daily. One po bid x 7 days Patient not taking:  Reported on 03/01/2018 12/21/17   Dartha LodgeFord, Kelsey N, PA-C  fluticasone [facility](FLONASE) 50 MCG/ACT nasal spray Place 2 sprays into both nostrils daily. Patient not taking: Reported on 03/02/2018 11/18/16   Loren RacerYelverton, David, MD    Family History Family History  Problem Relation Age of Onset  . Hypertension Mother   . CAD Father     Social History Social History   Tobacco Use  . Smoking status: Never Smoker  . Smokeless tobacco: Never Used  Substance Use Topics  . Alcohol use: No    Frequency: Never  . Drug  use: No    Types: Marijuana     Allergies   Codeine   Review of Systems Review of Systems  Constitutional: Negative for chills and fever.  HENT: Negative for congestion.   Respiratory: Negative for cough.   Cardiovascular: Negative for chest pain.  Gastrointestinal: Positive for abdominal pain, nausea and vomiting. Negative for diarrhea.  Genitourinary: Negative for dysuria and frequency.  Musculoskeletal: Negative for arthralgias and myalgias.  Neurological: Negative for headaches.  All other systems reviewed and are negative.    Physical Exam Updated Vital Signs BP (!) 171/100 (BP Location: Right Arm)   Pulse 68   Temp 99.4 F (37.4 C) (Oral)   Resp 17   Ht 5\' 8"  (1.727 m)   Wt 77.1 kg (170 lb)   LMP 02/28/2018   SpO2 100%   BMI 25.85 kg/m   Physical Exam  Constitutional: She is oriented to person, place, and time. She appears well-developed and well-nourished. No distress.  Room smells of marijuana  HENT:  Head: Normocephalic and atraumatic.  Nose: Nose normal.  Mouth/Throat: Oropharynx is clear and moist. No oropharyngeal exudate.  Eyes: Pupils are equal, round, and reactive to light. Conjunctivae are normal.  Neck: Normal range of motion. Neck supple.  Cardiovascular: Normal rate, regular rhythm, normal heart sounds and intact distal pulses.  Pulmonary/Chest: Effort normal and breath sounds normal. No stridor. She has no wheezes. She has no rales.  Abdominal: Soft. Bowel sounds are normal. She exhibits no mass. There is no tenderness. There is no rebound and no guarding.  Musculoskeletal: Normal range of motion.  Neurological: She is alert and oriented to person, place, and time. She displays normal reflexes.  Skin: Skin is warm and dry. Capillary refill takes less than 2 seconds.  Psychiatric: She has a normal mood and affect.  Nursing note and vitals reviewed.    ED Treatments / Results  Labs (all labs ordered are listed, but only abnormal results are  displayed) Results for orders placed or performed during the hospital encounter of 03/02/18  Lipase, blood  Result Value Ref Range   Lipase 24 11 - 51 U/L  Comprehensive metabolic panel  Result Value Ref Range   Sodium 141 135 - 145 mmol/L   Potassium 3.9 3.5 - 5.1 mmol/L   Chloride 105 98 - 111 mmol/L   CO2 26 22 - 32 mmol/L   Glucose, Bld 135 (H) 70 - 99 mg/dL   BUN 8 6 - 20 mg/dL   Creatinine, Ser 4.090.77 0.44 - 1.00 mg/dL   Calcium 9.4 8.9 - 81.110.3 mg/dL   Total Protein 8.1 6.5 - 8.1 g/dL   Albumin 4.7 3.5 - 5.0 g/dL   AST 23 15 - 41 U/L   ALT 29 0 - 44 U/L   Alkaline Phosphatase 45 38 - 126 U/L   Total Bilirubin 0.6 0.3 - 1.2 mg/dL   GFR calc non Af Amer >  60 >60 mL/min   GFR calc Af Amer >60 >60 mL/min   Anion gap 10 5 - 15  CBC  Result Value Ref Range   WBC 5.5 4.0 - 10.5 K/uL   RBC 4.45 3.87 - 5.11 MIL/uL   Hemoglobin 14.1 12.0 - 15.0 g/dL   HCT 16.1 09.6 - 04.5 %   MCV 91.9 78.0 - 100.0 fL   MCH 31.7 26.0 - 34.0 pg   MCHC 34.5 30.0 - 36.0 g/dL   RDW 40.9 81.1 - 91.4 %   Platelets 240 150 - 400 K/uL  Urinalysis, Routine w reflex microscopic  Result Value Ref Range   Color, Urine STRAW (A) YELLOW   APPearance CLEAR CLEAR   Specific Gravity, Urine 1.014 1.005 - 1.030   pH 7.0 5.0 - 8.0   Glucose, UA 50 (A) NEGATIVE mg/dL   Hgb urine dipstick MODERATE (A) NEGATIVE   Bilirubin Urine NEGATIVE NEGATIVE   Ketones, ur 80 (A) NEGATIVE mg/dL   Protein, ur NEGATIVE NEGATIVE mg/dL   Nitrite NEGATIVE NEGATIVE   Leukocytes, UA NEGATIVE NEGATIVE   RBC / HPF 0-5 0 - 5 RBC/hpf   WBC, UA 0-5 0 - 5 WBC/hpf   Bacteria, UA NONE SEEN NONE SEEN   Squamous Epithelial / LPF 0-5 0 - 5   Mucus PRESENT   I-Stat beta hCG blood, ED  Result Value Ref Range   I-stat hCG, quantitative <5.0 <5 mIU/mL   Comment 3           No results found.   Procedures Procedures (including critical care time)  Medications Ordered in ED Medications  ondansetron (ZOFRAN-ODT) disintegrating tablet 4  mg (has no administration in time range)  capsaicin (ZOSTRIX) 0.025 % cream ( Topical Given 03/02/18 0554)  haloperidol lactate (HALDOL) injection 5 mg (5 mg Intravenous Given 03/02/18 0536)  gi cocktail (Maalox,Lidocaine,Donnatal) (30 mLs Oral Given 03/02/18 0554)  sodium chloride 0.9 % bolus 500 mL (0 mLs Intravenous Stopped 03/02/18 0644)  ketorolac (TORADOL) 30 MG/ML injection 15 mg (15 mg Intravenous Given 03/02/18 0647)      PO challenged successfully, states pain is still there.  Exam is benign and reassuring.  I spoke to her with the nurse present about cessation of marijuana use Final Clinical Impressions(s) / ED Diagnoses   Final diagnoses:  Cannabinoid hyperemesis syndrome (HCC)   Return for pain, numbness, changes in vision or speech, fevers >100.4 unrelieved by medication, shortness of breath, intractable vomiting, or diarrhea, abdominal pain, Inability to tolerate liquids or food, cough, altered mental status or any concerns. No signs of systemic illness or infection. The patient is nontoxic-appearing on exam and vital signs are within normal limits. Will refer to urology for microscopy hematuria as patient is asymptomatic.  I have reviewed the triage vital signs and the nursing notes. Pertinent labs &imaging results that were available during my care of the patient were reviewed by me and considered in my medical decision making (see chart for details).  After history, exam, and medical workup I feel the patient has been appropriately medically screened and is safe for discharge home. Pertinent diagnoses were discussed with the patient. Patient was given return precautions.    Markeisha Mancias, MD 03/02/18 [phone]

## 2018-03-02 NOTE — ED Notes (Signed)
Gave pt gingerale for oral trial per Dr. Nicanor AlconPalumbo

## 2018-03-02 NOTE — ED Notes (Signed)
Pt ambulatory from triage room to treatment room with stand by assist only. Once in treatment room and lying on bed, pt request the bedpan because they don't want to keep getting up. I discussed with the patient that I will provide a w/c to the restroom and assist in the restroom. Brought w/c to room, pt refusing to get up, stating they will only use the bedpan at this time. Here with c/o sweating, abdominal pain and emesis. VSS at this time.

## 2018-03-02 NOTE — ED Notes (Signed)
Pt ambulated to restroom without difficulty prior to triage. Pt given urine cup to obtain sample.

## 2018-03-02 NOTE — ED Triage Notes (Signed)
Pt reports having vomiting with weakness and sweating. Pt was seen on 03/01/18 for same.

## 2018-03-04 ENCOUNTER — Other Ambulatory Visit: Payer: Self-pay

## 2018-03-04 ENCOUNTER — Emergency Department (HOSPITAL_BASED_OUTPATIENT_CLINIC_OR_DEPARTMENT_OTHER)
Admission: EM | Admit: 2018-03-04 | Discharge: 2018-03-04 | Disposition: A | Discharge status: Home / Self Care | Payer: 59 | Attending: Emergency Medicine | Admitting: Emergency Medicine

## 2018-03-04 ENCOUNTER — Encounter (HOSPITAL_BASED_OUTPATIENT_CLINIC_OR_DEPARTMENT_OTHER): Payer: Self-pay | Admitting: *Deleted

## 2018-03-04 ENCOUNTER — Emergency Department (HOSPITAL_BASED_OUTPATIENT_CLINIC_OR_DEPARTMENT_OTHER): Payer: 59

## 2018-03-04 DIAGNOSIS — R112 Nausea with vomiting, unspecified: Secondary | ICD-10-CM | POA: Diagnosis present

## 2018-03-04 DIAGNOSIS — G43A0 Cyclical vomiting, not intractable: Secondary | ICD-10-CM | POA: Diagnosis not present

## 2018-03-04 DIAGNOSIS — I10 Essential (primary) hypertension: Secondary | ICD-10-CM | POA: Diagnosis not present

## 2018-03-04 DIAGNOSIS — Z79899 Other long term (current) drug therapy: Secondary | ICD-10-CM | POA: Insufficient documentation

## 2018-03-04 DIAGNOSIS — E860 Dehydration: Secondary | ICD-10-CM

## 2018-03-04 DIAGNOSIS — R1115 Cyclical vomiting syndrome unrelated to migraine: Secondary | ICD-10-CM

## 2018-03-04 LAB — URINALYSIS, ROUTINE W REFLEX MICROSCOPIC
Bilirubin Urine: NEGATIVE
GLUCOSE, UA: NEGATIVE mg/dL
Ketones, ur: 40 mg/dL — AB
Leukocytes, UA: NEGATIVE
Nitrite: NEGATIVE
Protein, ur: NEGATIVE mg/dL
Specific Gravity, Urine: 1.01 (ref 1.005–1.030)
pH: 6.5 (ref 5.0–8.0)

## 2018-03-04 LAB — CBC WITH DIFFERENTIAL/PLATELET
BASOS PCT: 0 %
Basophils Absolute: 0 10*3/uL (ref 0.0–0.1)
EOS PCT: 0 %
Eosinophils Absolute: 0 10*3/uL (ref 0.0–0.7)
HCT: 43.1 % (ref 36.0–46.0)
Hemoglobin: 15.8 g/dL — ABNORMAL HIGH (ref 12.0–15.0)
LYMPHS ABS: 1.3 10*3/uL (ref 0.7–4.0)
Lymphocytes Relative: 25 %
MCH: 32.6 pg (ref 26.0–34.0)
MCHC: 36.7 g/dL — ABNORMAL HIGH (ref 30.0–36.0)
MCV: 89 fL (ref 78.0–100.0)
Monocytes Absolute: 0.8 10*3/uL (ref 0.1–1.0)
Monocytes Relative: 15 %
NEUTROS ABS: 3.1 10*3/uL (ref 1.7–7.7)
Neutrophils Relative %: 60 %
PLATELETS: 252 10*3/uL (ref 150–400)
RBC: 4.84 MIL/uL (ref 3.87–5.11)
RDW: 12 % (ref 11.5–15.5)
WBC: 5.2 10*3/uL (ref 4.0–10.5)

## 2018-03-04 LAB — COMPREHENSIVE METABOLIC PANEL
ALT: 24 U/L (ref 0–44)
AST: 21 U/L (ref 15–41)
Albumin: 5.1 g/dL — ABNORMAL HIGH (ref 3.5–5.0)
Alkaline Phosphatase: 51 U/L (ref 38–126)
Anion gap: 11 (ref 5–15)
BILIRUBIN TOTAL: 1.2 mg/dL (ref 0.3–1.2)
BUN: 17 mg/dL (ref 6–20)
CHLORIDE: 95 mmol/L — AB (ref 98–111)
CO2: 27 mmol/L (ref 22–32)
Calcium: 9.6 mg/dL (ref 8.9–10.3)
Creatinine, Ser: 1.03 mg/dL — ABNORMAL HIGH (ref 0.44–1.00)
Glucose, Bld: 110 mg/dL — ABNORMAL HIGH (ref 70–99)
POTASSIUM: 3.1 mmol/L — AB (ref 3.5–5.1)
Sodium: 133 mmol/L — ABNORMAL LOW (ref 135–145)
TOTAL PROTEIN: 8.9 g/dL — AB (ref 6.5–8.1)

## 2018-03-04 LAB — URINALYSIS, MICROSCOPIC (REFLEX)

## 2018-03-04 LAB — LIPASE, BLOOD: LIPASE: 51 U/L (ref 11–51)

## 2018-03-04 LAB — TROPONIN I: Troponin I: <0.03 ng/mL (ref <0.03)

## 2018-03-04 MED ORDER — FAMOTIDINE IN NACL 20-0.9 MG/50ML-% IV SOLN
20.0000 mg | Freq: Once | INTRAVENOUS | Status: AC
  Administered 2018-03-04: 20 mg via INTRAVENOUS
  Filled 2018-03-04: qty 50

## 2018-03-04 MED ORDER — MORPHINE SULFATE (PF) 4 MG/ML IV SOLN
4.0000 mg | Freq: Once | INTRAVENOUS | Status: AC
  Administered 2018-03-04: 4 mg via INTRAVENOUS
  Filled 2018-03-04: qty 1

## 2018-03-04 MED ORDER — AMLODIPINE BESYLATE 5 MG PO TABS
5.0000 mg | ORAL_TABLET | Freq: Once | ORAL | Status: AC
  Administered 2018-03-04: 5 mg via ORAL
  Filled 2018-03-04: qty 1

## 2018-03-04 MED ORDER — LABETALOL HCL 5 MG/ML IV SOLN
10.0000 mg | Freq: Once | INTRAVENOUS | Status: AC
  Administered 2018-03-04: 10 mg via INTRAVENOUS
  Filled 2018-03-04: qty 4

## 2018-03-04 MED ORDER — PROMETHAZINE HCL 25 MG/ML IJ SOLN
25.0000 mg | Freq: Once | INTRAMUSCULAR | Status: AC
  Administered 2018-03-04: 25 mg via INTRAVENOUS
  Filled 2018-03-04: qty 1

## 2018-03-04 MED ORDER — LORAZEPAM 0.5 MG PO TABS
0.5000 mg | ORAL_TABLET | Freq: Three times a day (TID) | ORAL | 0 refills | Status: AC | PRN
Start: 2018-03-04 — End: ?

## 2018-03-04 MED ORDER — METOCLOPRAMIDE HCL 5 MG/ML IJ SOLN
10.0000 mg | Freq: Once | INTRAMUSCULAR | Status: AC
  Administered 2018-03-04: 10 mg via INTRAVENOUS
  Filled 2018-03-04: qty 2

## 2018-03-04 MED ORDER — SODIUM CHLORIDE 0.9 % IV BOLUS
1000.0000 mL | Freq: Once | INTRAVENOUS | Status: AC
  Administered 2018-03-04: 1000 mL via INTRAVENOUS

## 2018-03-04 MED ORDER — CAPSAICIN 0.025 % EX CREA
TOPICAL_CREAM | Freq: Once | CUTANEOUS | Status: AC
  Administered 2018-03-04: 13:00:00 via TOPICAL
  Filled 2018-03-04 (×2): qty 60

## 2018-03-04 MED ORDER — LACTATED RINGERS IV BOLUS
1000.0000 mL | Freq: Once | INTRAVENOUS | Status: AC
  Administered 2018-03-04: 1000 mL via INTRAVENOUS

## 2018-03-04 NOTE — ED Triage Notes (Signed)
Pt reports emesis, generalized weakness and sweating for over a week, seen at [facility] er x 2 this week for same, dx with marijuana induced hyperemesis per pt. Pt states she does not agree with this dx so she came here today.

## 2018-03-04 NOTE — ED Notes (Signed)
Patient transported to X-ray 

## 2018-03-04 NOTE — ED Provider Notes (Signed)
[facility] EMERGENCY DEPARTMENT Provider Note   CSN: [id number] Arrival date & time: 03/04/18  1048     History   Chief Complaint Chief Complaint  Patient presents with  . Emesis    HPI Chelsey Coolshley Dymond is a 27 y.o. female.  The history is provided by the patient. No language interpreter was used.  Emesis     Chelsey Hall is a 27 y.o. female who presents to the Emergency Department complaining of nausea vomiting.  She reports several years of intermittent nausea/vomiting.  Over the last 1-2 weeks have significantly worsened.  She reports epigastric cramping whenever she tries to drink followed by nausea/vomiting.  Pain radiates to back and low back.  She has associated diaphoresis.  Two days ago she had a temp to 100.3.  She endorses associated constipation, last BM 6 days ago.  She endorses decreased urinary output and hourly vomiting.  She has associated generalized weakness.  She has taken zofran and phenergan without relief (vomits after taking).  Denies tobacco or alcohol use.  She smokes occasional marijuana - last use two weeks ago.    Past Medical History:  Diagnosis Date  . Head ache   . Hypertension   . Mass of heart   . 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 Pericarditis     There are no active problems to display for this patient.   Past Surgical History:  Procedure Laterality Date  . APPENDECTOMY       OB History   None      Home Medications    Prior to Admission medications   Medication Sig Start Date End Date Taking? Authorizing Provider  amLODipine (NORVASC) 5 MG tablet Take 5 mg by mouth daily.     [provider]  doxycycline (VIBRAMYCIN) 100 MG capsule Take 1 capsule (100 mg total) by mouth 2 (two) times daily. One po bid x 7 days Patient not taking: Reported on 03/01/2018 12/21/17   Dartha LodgeFord, Kelsey N, PA-C  fluticasone [facility](FLONASE) 50 MCG/ACT nasal spray Place 2 sprays into both nostrils daily. Patient not taking: Reported on  03/02/2018 11/18/16   Loren RacerYelverton, David, MD  ibuprofen (ADVIL,MOTRIN) 200 MG tablet Take 400 mg by mouth every 6 (six) hours as needed for moderate pain.    [provider]  lisinopril-hydrochlorothiazide (PRINZIDE,ZESTORETIC) 20-12.5 MG tablet Take 1 tablet by mouth daily. 08/29/16   [provider]  LORazepam (ATIVAN) 0.5 MG tablet Take 1 tablet (0.5 mg total) by mouth every 8 (eight) hours as needed for anxiety (nausea). 03/04/18   Tilden Fossaees, Mairyn Lenahan, MD  metoprolol succinate (TOPROL-XL) 25 MG 24 hr tablet Take 25 mg by mouth daily.  07/22/17   [provider]  omeprazole (PRILOSEC) 20 MG capsule Take 1 capsule (20 mg total) by mouth daily. 05/29/17   Bethel BornGekas, Kelly Marie, PA-C  ondansetron (ZOFRAN-ODT) 8 MG disintegrating tablet Take 8 mg by mouth every 8 (eight) hours as needed for nausea or vomiting.    [provider]  promethazine (PHENERGAN) 25 MG suppository Place 1 suppository (25 mg total) rectally every 6 (six) hours as needed for nausea or vomiting. 03/01/18   Jaynie CrumbleKirichenko, Tatyana, PA-C    Family History Family History  Problem Relation Age of Onset  . Hypertension Mother   . CAD Father     Social History Social History   Tobacco Use  . Smoking status: Never Smoker  . Smokeless tobacco: Never Used  Substance Use Topics  . Alcohol use: No  Frequency: Never  . Drug use: No    Types: Marijuana     Allergies   Codeine   Review of Systems Review of Systems  Gastrointestinal: Positive for vomiting.  All other systems reviewed and are negative.    Physical Exam Updated Vital Signs BP (!) 205/120   Pulse 78   Temp 98.8 F (37.1 C) (Oral)   Resp 18   Ht 5\' 8"  (1.727 m)   Wt 81.6 kg (180 lb)   LMP 02/28/2018   SpO2 100%   BMI 27.37 kg/m   Physical Exam  Constitutional: She is oriented to person, place, and time. She appears well-developed and well-nourished.  HENT:  Head: Normocephalic and atraumatic.  Cardiovascular: Normal rate  and regular rhythm.  No murmur heard. Pulmonary/Chest: Effort normal and breath sounds normal. No respiratory distress.  Abdominal: Soft. There is no rebound and no guarding.  Mild generalized abdominal tenderness  Musculoskeletal: She exhibits no edema or tenderness.  Neurological: She is alert and oriented to person, place, and time.  Skin: Skin is warm and dry.  Psychiatric: She has a normal mood and affect. Her behavior is normal.  Nursing note and vitals reviewed.    ED Treatments / Results  Labs (all labs ordered are listed, but only abnormal results are displayed) Labs Reviewed  COMPREHENSIVE METABOLIC PANEL - Abnormal; Notable for the following components:      Result Value   Sodium 133 (*)    Potassium 3.1 (*)    Chloride 95 (*)    Glucose, Bld 110 (*)    Creatinine, Ser 1.03 (*)    Total Protein 8.9 (*)    Albumin 5.1 (*)    All other components within normal limits  CBC WITH DIFFERENTIAL/PLATELET - Abnormal; Notable for the following components:   Hemoglobin 15.8 (*)    MCHC 36.7 (*)    All other components within normal limits  URINALYSIS, ROUTINE W REFLEX MICROSCOPIC - Abnormal; Notable for the following components:   APPearance HAZY (*)    Hgb urine dipstick TRACE (*)    Ketones, ur 40 (*)    All other components within normal limits  URINALYSIS, MICROSCOPIC (REFLEX) - Abnormal; Notable for the following components:   Bacteria, UA FEW (*)    All other components within normal limits  LIPASE, BLOOD  TROPONIN I    EKG EKG Interpretation  Date/Time:  Thursday March 04 2018 13:09:42 EDT Ventricular Rate:  95 PR Interval:    QRS Duration: 74 QT Interval:  377 QTC Calculation: 474 R Axis:   -62 Text Interpretation:  Sinus rhythm LAE, consider biatrial enlargement Left anterior fascicular block Borderline T wave abnormalities Confirmed by Tilden Fossa [phone]) on 03/04/2018 1:15:54 PM   Radiology Dg Abd Acute W/chest  Result Date: 03/04/2018 CLINICAL  DATA:  Vomiting for 1 week, weakness, fatigue EXAM: DG ABDOMEN ACUTE W/ 1V CHEST COMPARISON:  CT abdomen pelvis of 08/18/2017 and portable chest x-ray of the same date FINDINGS: No active infiltrate or effusion is seen. Mediastinal and hilar contours are unremarkable. The heart is within normal limits in size. No bony abnormality is seen. Supine and erect views of the abdomen show both large and small bowel gas to be present with no obstruction. No free air seen on the erect view. No opaque calculi are noted. No bony abnormality is seen. IMPRESSION: 1. No active lung disease. 2. No bowel obstruction.  No free air. 3. No opaque calculi. Electronically Signed   By: Renae Fickle  Gery Pray M.D.   On: 03/04/2018 16:15    Procedures Procedures (including critical care time)  Medications Ordered in ED Medications  sodium chloride 0.9 % bolus 1,000 mL (0 mLs Intravenous Stopped 03/04/18 1350)  promethazine (PHENERGAN) injection 25 mg (25 mg Intravenous Given 03/04/18 1250)  famotidine (PEPCID) IVPB 20 mg premix (0 mg Intravenous Stopped 03/04/18 1349)  capsaicin (ZOSTRIX) 0.025 % cream ( Topical Given 03/04/18 1255)  lactated ringers bolus 1,000 mL (0 mLs Intravenous Stopped 03/04/18 1543)  amLODipine (NORVASC) tablet 5 mg (5 mg Oral Given 03/04/18 1417)  morphine 4 MG/ML injection 4 mg (4 mg Intravenous Given 03/04/18 1400)  metoCLOPramide (REGLAN) injection 10 mg (10 mg Intravenous Given 03/04/18 1401)  labetalol (NORMODYNE,TRANDATE) injection 10 mg (10 mg Intravenous Given 03/04/18 1540)  promethazine (PHENERGAN) injection 25 mg (25 mg Intravenous Given 03/04/18 1540)     Initial Impression / Assessment and Plan / ED Course  I have reviewed the triage vital signs and the nursing notes.  Pertinent labs & imaging results that were available during my care of the patient were reviewed by me and considered in my medical decision making (see chart for details).    Patient with history of sickle, vomiting here for  evaluation of nausea, vomiting for the last week. She has generalized tenderness on examination without any peritoneal findings. She does also endorse some left sided chest discomfort that is waxing and waning. She has been vomiting her antihypertensives. She is noted to be hypertensive in the emergency department, but is also noted to have hypertension on most of her ED visits. EKG with nonspecific ST changes, unchanged from priors. Labs demonstrate hyponatremia with mild elevation and creatinine consistent with dehydration. She was treated with IV fluids, antiemetics and pain control. Haldol was not given as she stated that she did not like the way it made her feel. On repeat assessment her pain is resolved but she does have some mild nausea. Current presentation is not consistent with bowel obstruction, acute abdomen. Plan to treat her symptoms with close G.I. follow-up as well as return precautions. She states that she does have multiple antiemetics available at home.  Final Clinical Impressions(s) / ED Diagnoses   Final diagnoses:  Dehydration  Non-intractable cyclical vomiting with nausea  Essential hypertension    ED Discharge Orders        Ordered    LORazepam (ATIVAN) 0.5 MG tablet  Every 8 hours PRN     03/04/18 1630       Tilden Fossa, MD 03/04/18 [phone]

## 2018-03-04 NOTE — ED Notes (Signed)
Pt attempted to sip on some water. Pt states it increased her nausea.

## 2018-05-22 ENCOUNTER — Emergency Department (HOSPITAL_COMMUNITY)
Admission: EM | Admit: 2018-05-22 | Discharge: 2018-05-22 | Discharge status: Against Medical Advice | Payer: 59 | Attending: Emergency Medicine | Admitting: Emergency Medicine

## 2018-05-22 ENCOUNTER — Encounter (HOSPITAL_COMMUNITY): Payer: Self-pay

## 2018-05-22 ENCOUNTER — Other Ambulatory Visit: Payer: Self-pay

## 2018-05-22 DIAGNOSIS — I10 Essential (primary) hypertension: Secondary | ICD-10-CM | POA: Insufficient documentation

## 2018-05-22 DIAGNOSIS — R112 Nausea with vomiting, unspecified: Secondary | ICD-10-CM | POA: Diagnosis not present

## 2018-05-22 DIAGNOSIS — Z5320 Procedure and treatment not carried out because of patient's decision for unspecified reasons: Secondary | ICD-10-CM | POA: Insufficient documentation

## 2018-05-22 DIAGNOSIS — F1290 Cannabis use, unspecified, uncomplicated: Secondary | ICD-10-CM | POA: Insufficient documentation

## 2018-05-22 DIAGNOSIS — R1013 Epigastric pain: Secondary | ICD-10-CM | POA: Insufficient documentation

## 2018-05-22 LAB — CBC WITH DIFFERENTIAL/PLATELET
BASOS PCT: 0 %
Basophils Absolute: 0 10*3/uL (ref 0.0–0.1)
EOS ABS: 0 10*3/uL (ref 0.0–0.7)
EOS PCT: 0 %
HCT: 40.2 % (ref 36.0–46.0)
Hemoglobin: 14.2 g/dL (ref 12.0–15.0)
LYMPHS ABS: 0.8 10*3/uL (ref 0.7–4.0)
Lymphocytes Relative: 14 %
MCH: 32.6 pg (ref 26.0–34.0)
MCHC: 35.3 g/dL (ref 30.0–36.0)
MCV: 92.2 fL (ref 78.0–100.0)
MONOS PCT: 6 %
Monocytes Absolute: 0.3 10*3/uL (ref 0.1–1.0)
NEUTROS PCT: 80 %
Neutro Abs: 4.4 10*3/uL (ref 1.7–7.7)
PLATELETS: 254 10*3/uL (ref 150–400)
RBC: 4.36 MIL/uL (ref 3.87–5.11)
RDW: 12.3 % (ref 11.5–15.5)
WBC: 5.5 10*3/uL (ref 4.0–10.5)

## 2018-05-22 LAB — LIPASE, BLOOD: Lipase: 33 U/L (ref 11–51)

## 2018-05-22 LAB — COMPREHENSIVE METABOLIC PANEL
ALBUMIN: 4.7 g/dL (ref 3.5–5.0)
ALT: 20 U/L (ref 0–44)
ANION GAP: 13 (ref 5–15)
AST: 25 U/L (ref 15–41)
Alkaline Phosphatase: 42 U/L (ref 38–126)
BUN: 9 mg/dL (ref 6–20)
CALCIUM: 10 mg/dL (ref 8.9–10.3)
CHLORIDE: 108 mmol/L (ref 98–111)
CO2: 24 mmol/L (ref 22–32)
CREATININE: 0.84 mg/dL (ref 0.44–1.00)
GFR calc non Af Amer: >60 mL/min (ref >60)
GLUCOSE: 135 mg/dL — AB (ref 70–99)
POTASSIUM: 3.7 mmol/L (ref 3.5–5.1)
SODIUM: 145 mmol/L (ref 135–145)
TOTAL PROTEIN: 7.9 g/dL (ref 6.5–8.1)
Total Bilirubin: 1 mg/dL (ref 0.3–1.2)

## 2018-05-22 LAB — RAPID URINE DRUG SCREEN, HOSP PERFORMED
Amphetamines: NOT DETECTED
Barbiturates: NOT DETECTED
Benzodiazepines: NOT DETECTED
COCAINE: NOT DETECTED
Opiates: NOT DETECTED
Tetrahydrocannabinol: POSITIVE — AB

## 2018-05-22 LAB — ETHANOL

## 2018-05-22 LAB — URINALYSIS, ROUTINE W REFLEX MICROSCOPIC
BILIRUBIN URINE: NEGATIVE
Glucose, UA: NEGATIVE mg/dL
Hgb urine dipstick: NEGATIVE
KETONES UR: 20 mg/dL — AB
Leukocytes, UA: NEGATIVE
NITRITE: NEGATIVE
Protein, ur: NEGATIVE mg/dL
Specific Gravity, Urine: 1.015 (ref 1.005–1.030)
pH: 7 (ref 5.0–8.0)

## 2018-05-22 LAB — I-STAT BETA HCG BLOOD, ED (MC, WL, AP ONLY): I-stat hCG, quantitative: <5 m[IU]/mL (ref <5)

## 2018-05-22 MED ORDER — ONDANSETRON HCL 4 MG/2ML IJ SOLN
4.0000 mg | Freq: Once | INTRAMUSCULAR | Status: AC
  Administered 2018-05-22: 4 mg via INTRAVENOUS
  Filled 2018-05-22: qty 2

## 2018-05-22 MED ORDER — SODIUM CHLORIDE 0.9 % IV BOLUS
1000.0000 mL | Freq: Once | INTRAVENOUS | Status: AC
  Administered 2018-05-22: 1000 mL via INTRAVENOUS

## 2018-05-22 NOTE — ED Notes (Signed)
Bed: ZO10 Expected date:  Expected time:  Means of arrival:  Comments: 27 yo N/V-pancreatitis

## 2018-05-22 NOTE — ED Notes (Signed)
Patient ambulatory to nurses station requesting restroom. Patient offered wheel chair. States "I'll walk." Provided with urine cup.

## 2018-05-22 NOTE — ED Triage Notes (Addendum)
EMS reports from home, ETOH ingestion last night, woke up with nausea and vomiting, Pt believes it is a Pancreatitis exacerbation. Hx of Pancreatitis.   BP 170/100 HR 70 RR 20 Sp02 100 RA  22 Left hand (accidentally pulled out by Pt at admission)  4mg  Zofran enroute without relief

## 2018-05-22 NOTE — ED Provider Notes (Signed)
[facility]-EMERGENCY DEPT Provider Note   CSN: [id number] Arrival date & time: 05/22/18  1357     History   Chief Complaint Chief Complaint  Patient presents with  . Abdominal Pain  . Nausea  . Emesis    HPI Chelsey Hall is a 27 y.o. female duration of nausea/vomiting that began approximately 8 AM.  Patient has a history of pancreatitis and she states she feels like she has pancreatitis.  She endorses drinking alcohol last night and states that she drank approximately 2 shots of cirrhotic.  She states that she normally does not drink.  She reports she has had consistent vomiting since this morning.  No blood noted in the vomit.  She does report that it was slightly blood-tinged but no gross hematemesis.  She took 1 ODT Zofran that she had initially and states that did not have improvement.  Patient does report that she smokes marijuana 1-2 times a week and does report recent marijuana use.  She describes as epigastric pain is constant in nature.  She states it feels similar to when she had pancreatitis.  She denies any chest pain, fevers.  Her last bowel movement was earlier today and was normal.  Patient denies any chest pain, difficulty breathing, urinary complaints.   The history is provided by the patient.    Past Medical History:  Diagnosis Date  . Head ache   . Hypertension   . Mass of heart   . 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 Pericarditis     There are no active problems to display for this patient.   Past Surgical History:  Procedure Laterality Date  . APPENDECTOMY       OB History   None      Home Medications    Prior to Admission medications   Medication Sig Start Date End Date Taking? Authorizing Provider  LORazepam (ATIVAN) 0.5 MG tablet Take 1 tablet (0.5 mg total) by mouth every 8 (eight) hours as needed for anxiety (nausea). Patient not taking: Reported on 05/22/2018 03/04/18   Tilden Fossa, MD  omeprazole  (PRILOSEC) 20 MG capsule Take 1 capsule (20 mg total) by mouth daily. Patient not taking: Reported on 05/22/2018 05/29/17   Bethel Born, PA-C  promethazine (PHENERGAN) 25 MG suppository Place 1 suppository (25 mg total) rectally every 6 (six) hours as needed for nausea or vomiting. Patient not taking: Reported on 05/22/2018 03/01/18   Jaynie Crumble, PA-C    Family History Family History  Problem Relation Age of Onset  . Hypertension Mother   . CAD Father     Social History Social History   Tobacco Use  . Smoking status: Never Smoker  . Smokeless tobacco: Never Used  Substance Use Topics  . Alcohol use: No    Frequency: Never  . Drug use: No    Types: Marijuana     Allergies   Codeine   Review of Systems Review of Systems  Constitutional: Negative for fever.  Respiratory: Negative for cough and shortness of breath.   Cardiovascular: Negative for chest pain.  Gastrointestinal: Positive for abdominal pain, nausea and vomiting.  Genitourinary: Negative for dysuria and hematuria.  Neurological: Negative for headaches.  All other systems reviewed and are negative.    Physical Exam Updated Vital Signs BP (!) 148/88   Pulse 77   Temp 97.9 F (36.6 C) (Oral)   Resp 16   Ht 5\' 8"  (1.727 m)   Wt 84.4 kg  SpO2 100%   BMI 28.28 kg/m   Physical Exam  Constitutional: She appears well-developed and well-nourished.  HENT:  Head: Normocephalic and atraumatic.  Mouth/Throat: Oropharynx is clear and moist and mucous membranes are normal.  Eyes: Pupils are equal, round, and reactive to light. Conjunctivae, EOM and lids are normal.  PERRL, EOMs intact  Neck: Full passive range of motion without pain.  Cardiovascular: Normal rate, regular rhythm, normal heart sounds and normal pulses. Exam reveals no gallop and no friction rub.  No murmur heard. Pulmonary/Chest: Effort normal and breath sounds normal.  Lungs clear to auscultation bilaterally.  Symmetric chest  rise.  No wheezing, rales, rhonchi.  Abdominal: Soft. Normal appearance. There is tenderness in the right upper quadrant, epigastric area and left upper quadrant. There is no rigidity, no guarding and no CVA tenderness.  Abdomen is soft, nondistended.  Tenderness noted to the upper abdomen at the epigastric and bilateral upper quadrants.  No rigidity, guarding.  No CVA tenderness bilaterally.  Musculoskeletal: Normal range of motion.  Neurological: She is alert.  Follows commands Moves all extremities 5 out of 5 strength bilateral upper and lower extremities. Normal gait No slurred speech.   Patient is alert and answering questions appropriately.  When asked who she is and what year it is, she is able to tell me though she states that it is October.  Skin: Skin is warm and dry. Capillary refill takes less than 2 seconds.  Psychiatric: She has a normal mood and affect. Her speech is normal.  Nursing note and vitals reviewed.    ED Treatments / Results  Labs (all labs ordered are listed, but only abnormal results are displayed) Labs Reviewed  COMPREHENSIVE METABOLIC PANEL - Abnormal; Notable for the following components:      Result Value   Glucose, Bld 135 (*)    All other components within normal limits  URINALYSIS, ROUTINE W REFLEX MICROSCOPIC - Abnormal; Notable for the following components:   APPearance HAZY (*)    Ketones, ur 20 (*)    All other components within normal limits  RAPID URINE DRUG SCREEN, HOSP PERFORMED - Abnormal; Notable for the following components:   Tetrahydrocannabinol POSITIVE (*)    All other components within normal limits  LIPASE, BLOOD  CBC WITH DIFFERENTIAL/PLATELET  ETHANOL  I-STAT BETA HCG BLOOD, ED ([facility], [facility], [facility] ONLY)    EKG None  Radiology No results found.  Procedures Procedures (including critical care time)  Medications Ordered in ED Medications  sodium chloride 0.9 % bolus 1,000 mL (0 mLs Intravenous Stopped 05/22/18 1649)    ondansetron (ZOFRAN) injection 4 mg (4 mg Intravenous Given 05/22/18 1522)     Initial Impression / Assessment and Plan / ED Course  I have reviewed the triage vital signs and the nursing notes.  Pertinent labs & imaging results that were available during my care of the patient were reviewed by me and considered in my medical decision making (see chart for details).     27 year old female brought in by EMS for evaluation of abdominal pain, nausea/vomiting.  Does endorse alcohol use last night.  Also reports marijuana use.  States history of pink otitis and states that this feels similar. Patient is afebrile, non-toxic appearing, sitting comfortably on examination table. Vital signs reviewed and stable.  Patient with some tenderness noted to the upper abdomen.  No rigidity, guarding.  Patient is alert with no slurred speech.  She is answering questions properly but does state that it is October  when asked what month it is.  Will plan to check basic labs.  Ethanol level is unremarkable.  UDS is positive for marijuana.  I-STAT beta negative.  UA negative.  CMP is unremarkable.  CBC without any significant leukocytosis or anemia.  Lipase is unremarkable.  I went to go evaluate patient.  RN informed me that patient eloped.  I was not informed of patient wanting to leave.  She left prior to complete minute of treatment.   Final Clinical Impressions(s) / ED Diagnoses   Final diagnoses:  Epigastric pain    ED Discharge Orders    None       Maxwell Caul, PA-C 05/23/18 0003    Derwood Kaplan, MD 05/23/18 [phone]

## 2018-06-25 ENCOUNTER — Encounter (HOSPITAL_COMMUNITY): Payer: Self-pay

## 2018-06-25 ENCOUNTER — Emergency Department (HOSPITAL_COMMUNITY)
Admission: EM | Admit: 2018-06-25 | Discharge: 2018-06-25 | Disposition: A | Discharge status: Home / Self Care | Payer: 59 | Attending: Emergency Medicine | Admitting: Emergency Medicine

## 2018-06-25 ENCOUNTER — Other Ambulatory Visit: Payer: Self-pay

## 2018-06-25 DIAGNOSIS — E876 Hypokalemia: Secondary | ICD-10-CM | POA: Diagnosis not present

## 2018-06-25 DIAGNOSIS — R112 Nausea with vomiting, unspecified: Secondary | ICD-10-CM | POA: Insufficient documentation

## 2018-06-25 DIAGNOSIS — R1084 Generalized abdominal pain: Secondary | ICD-10-CM

## 2018-06-25 LAB — MAGNESIUM: Magnesium: 2.4 mg/dL (ref 1.7–2.4)

## 2018-06-25 LAB — URINALYSIS, ROUTINE W REFLEX MICROSCOPIC
BILIRUBIN URINE: NEGATIVE
GLUCOSE, UA: NEGATIVE mg/dL
Hgb urine dipstick: NEGATIVE
KETONES UR: 5 mg/dL — AB
Leukocytes, UA: NEGATIVE
Nitrite: NEGATIVE
PH: 6 (ref 5.0–8.0)
PROTEIN: 30 mg/dL — AB
Specific Gravity, Urine: 1.013 (ref 1.005–1.030)

## 2018-06-25 LAB — CBC WITH DIFFERENTIAL/PLATELET
Abs Immature Granulocytes: 0.04 10*3/uL (ref 0.00–0.07)
BASOS ABS: 0 10*3/uL (ref 0.0–0.1)
Basophils Relative: 0 %
EOS ABS: 0 10*3/uL (ref 0.0–0.5)
EOS PCT: 0 %
HEMATOCRIT: 42.7 % (ref 36.0–46.0)
HEMOGLOBIN: 14.8 g/dL (ref 12.0–15.0)
Immature Granulocytes: 1 %
LYMPHS ABS: 1.3 10*3/uL (ref 0.7–4.0)
LYMPHS PCT: 15 %
MCH: 31.4 pg (ref 26.0–34.0)
MCHC: 34.7 g/dL (ref 30.0–36.0)
MCV: 90.5 fL (ref 80.0–100.0)
MONO ABS: 1 10*3/uL (ref 0.1–1.0)
Monocytes Relative: 12 %
Neutro Abs: 6.3 10*3/uL (ref 1.7–7.7)
Neutrophils Relative %: 72 %
Platelets: 239 10*3/uL (ref 150–400)
RBC: 4.72 MIL/uL (ref 3.87–5.11)
RDW: 11.8 % (ref 11.5–15.5)
WBC: 8.6 10*3/uL (ref 4.0–10.5)
nRBC: 0 % (ref 0.0–0.2)

## 2018-06-25 LAB — COMPREHENSIVE METABOLIC PANEL
ALBUMIN: 4.8 g/dL (ref 3.5–5.0)
ALK PHOS: 44 U/L (ref 38–126)
ALT: 20 U/L (ref 0–44)
ANION GAP: 10 (ref 5–15)
AST: 27 U/L (ref 15–41)
BUN: 15 mg/dL (ref 6–20)
CALCIUM: 9.5 mg/dL (ref 8.9–10.3)
CO2: 29 mmol/L (ref 22–32)
CREATININE: 0.93 mg/dL (ref 0.44–1.00)
Chloride: 97 mmol/L — ABNORMAL LOW (ref 98–111)
GFR calc Af Amer: >60 mL/min (ref >60)
GFR calc non Af Amer: >60 mL/min (ref >60)
GLUCOSE: 107 mg/dL — AB (ref 70–99)
POTASSIUM: 3.1 mmol/L — AB (ref 3.5–5.1)
Sodium: 136 mmol/L (ref 135–145)
TOTAL PROTEIN: 8.6 g/dL — AB (ref 6.5–8.1)
Total Bilirubin: 0.9 mg/dL (ref 0.3–1.2)

## 2018-06-25 LAB — LIPASE, BLOOD: Lipase: 28 U/L (ref 11–51)

## 2018-06-25 LAB — HCG, SERUM, QUALITATIVE: Preg, Serum: NEGATIVE

## 2018-06-25 MED ORDER — SODIUM CHLORIDE 0.9 % IV BOLUS
1000.0000 mL | Freq: Once | INTRAVENOUS | Status: AC
  Administered 2018-06-25: 1000 mL via INTRAVENOUS

## 2018-06-25 MED ORDER — ONDANSETRON HCL 4 MG/2ML IJ SOLN
4.0000 mg | Freq: Once | INTRAMUSCULAR | Status: AC
  Administered 2018-06-25: 4 mg via INTRAVENOUS
  Filled 2018-06-25: qty 2

## 2018-06-25 MED ORDER — OMEPRAZOLE 20 MG PO CPDR: 20.0000 mg | DELAYED_RELEASE_CAPSULE | Freq: Every day | ORAL | 0 refills | Status: AC

## 2018-06-25 MED ORDER — POTASSIUM CHLORIDE 10 MEQ/100ML IV SOLN
10.0000 meq | INTRAVENOUS | Status: AC
  Administered 2018-06-25 (×2): 10 meq via INTRAVENOUS
  Filled 2018-06-25 (×2): qty 100

## 2018-06-25 MED ORDER — FAMOTIDINE IN NACL 20-0.9 MG/50ML-% IV SOLN
20.0000 mg | Freq: Once | INTRAVENOUS | Status: AC
  Administered 2018-06-25: 20 mg via INTRAVENOUS
  Filled 2018-06-25: qty 50

## 2018-06-25 MED ORDER — MORPHINE SULFATE (PF) 4 MG/ML IV SOLN
4.0000 mg | Freq: Once | INTRAVENOUS | Status: AC
  Administered 2018-06-25: 4 mg via INTRAVENOUS
  Filled 2018-06-25: qty 1

## 2018-06-25 MED ORDER — ONDANSETRON HCL 4 MG PO TABS: 4.0000 mg | ORAL_TABLET | Freq: Four times a day (QID) | ORAL | 0 refills | Status: AC

## 2018-06-25 MED ORDER — PROMETHAZINE HCL 25 MG/ML IJ SOLN
25.0000 mg | Freq: Once | INTRAMUSCULAR | Status: AC
  Administered 2018-06-25: 25 mg via INTRAVENOUS
  Filled 2018-06-25: qty 1

## 2018-06-25 NOTE — Discharge Instructions (Addendum)
You were evaluated in the emergency department for abdominal pain nausea and vomiting.  Your blood work showed that your potassium was little low but otherwise was unremarkable.  We are prescribing some medication to help with acid and nausea.  He will be important for you to follow-up with your doctors.  Please return if any concerning symptoms.

## 2018-06-25 NOTE — ED Notes (Signed)
Pt. Urine specimen and urine culture sent to lab.

## 2018-06-25 NOTE — ED Triage Notes (Signed)
Patient BIB EMS from home with complaints of abdominal pain. Patient has history of pancreatitis and was diagnosed with pancreatitis flair a couple days ago. Patient reports decreased oral intake, including medications. Patient febrile for EMS. Patient received 4mg  Zofran IV by EMS, IV NS, 250mg  PO tylenol. 18G PIV Right AC placed by EMS. Patient also complains of decreased urination and decreased bowel movements since her pancreatitis flair up began.

## 2018-06-25 NOTE — ED Notes (Signed)
Bed: WA09 Expected date:  Expected time:  Means of arrival:  Comments: EMS- pancreatitis, tachycardic, febrile

## 2018-06-25 NOTE — ED Provider Notes (Signed)
[facility]-EMERGENCY DEPT Provider Note   CSN: [id number] Arrival date & time: 06/25/18  1814     History   Chief Complaint Chief Complaint  Patient presents with  . Abdominal Pain    HPI Chelsey Hall is a 27 y.o. female.  Patient arrives by EMS with complaint of abdominal and throat pain nausea vomiting multiple times decreased ability to take fluids by mouth decreased urination and constipation.  Patient states she has had these problems before and she is seen GI in the past and had an endoscopy.  She denies any active alcohol use and has not smoked marijuana for a couple of weeks.  No fevers no chills no cough.  No blood from above or below.  The history is provided by the patient.  Abdominal Pain   This is a recurrent problem. The current episode started 2 days ago. The problem occurs constantly. The problem has not changed since onset.The pain is associated with eating. The pain is located in the generalized abdominal region. The quality of the pain is sharp. The pain is moderate. Associated symptoms include nausea, vomiting and constipation. Pertinent negatives include fever, diarrhea, melena, dysuria, frequency, hematuria and headaches. The symptoms are aggravated by eating. Nothing relieves the symptoms. Past workup includes GI consult and CT scan.    Past Medical History:  Diagnosis Date  . Head ache   . Hypertension   . Mass of heart   . 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 Pericarditis     There are no active problems to display for this patient.   Past Surgical History:  Procedure Laterality Date  . APPENDECTOMY       OB History   None      Home Medications    Prior to Admission medications   Medication Sig Start Date End Date Taking? Authorizing Provider  LORazepam (ATIVAN) 0.5 MG tablet Take 1 tablet (0.5 mg total) by mouth every 8 (eight) hours as needed for anxiety (nausea). Patient not taking: Reported on 05/22/2018  03/04/18   Tilden Fossa, MD  omeprazole (PRILOSEC) 20 MG capsule Take 1 capsule (20 mg total) by mouth daily. Patient not taking: Reported on 05/22/2018 05/29/17   Bethel Born, PA-C  promethazine (PHENERGAN) 25 MG suppository Place 1 suppository (25 mg total) rectally every 6 (six) hours as needed for nausea or vomiting. Patient not taking: Reported on 05/22/2018 03/01/18   Jaynie Crumble, PA-C    Family History Family History  Problem Relation Age of Onset  . Hypertension Mother   . CAD Father     Social History Social History   Tobacco Use  . Smoking status: Never Smoker  . Smokeless tobacco: Never Used  Substance Use Topics  . Alcohol use: No    Frequency: Never  . Drug use: No    Types: Marijuana     Allergies   Codeine   Review of Systems Review of Systems  Constitutional: Negative for fever.  HENT: Negative for sore throat.   Eyes: Negative for visual disturbance.  Respiratory: Negative for shortness of breath.   Cardiovascular: Negative for chest pain.  Gastrointestinal: Positive for abdominal pain, constipation, nausea and vomiting. Negative for diarrhea and melena.  Genitourinary: Negative for dysuria, frequency and hematuria.  Musculoskeletal: Negative for neck pain.  Skin: Negative for rash.  Neurological: Negative for headaches.     Physical Exam Updated Vital Signs BP (!) 163/108   Pulse 98   Resp 15   Wt  84.4 kg   SpO2 100%   BMI 28.29 kg/m   Physical Exam  Constitutional: She is oriented to person, place, and time. She appears well-developed and well-nourished. No distress.  HENT:  Head: Normocephalic and atraumatic.  Mouth/Throat: Oropharynx is clear and moist.  Eyes: Conjunctivae are normal.  Neck: Neck supple.  Cardiovascular: Normal rate, regular rhythm and normal heart sounds.  No murmur heard. Pulmonary/Chest: Effort normal and breath sounds normal. No respiratory distress.  Abdominal: Soft. She exhibits no mass. There  is generalized tenderness. There is no rigidity and no guarding.  Musculoskeletal: She exhibits no edema, tenderness or deformity.  Neurological: She is alert and oriented to person, place, and time.  Skin: Skin is warm and dry. Capillary refill takes less than 2 seconds.  Psychiatric: She has a normal mood and affect.  Nursing note and vitals reviewed.    ED Treatments / Results  Labs (all labs ordered are listed, but only abnormal results are displayed) Labs Reviewed  COMPREHENSIVE METABOLIC PANEL - Abnormal; Notable for the following components:      Result Value   Potassium 3.1 (*)    Chloride 97 (*)    Glucose, Bld 107 (*)    Total Protein 8.6 (*)    All other components within normal limits  URINALYSIS, ROUTINE W REFLEX MICROSCOPIC - Abnormal; Notable for the following components:   Ketones, ur 5 (*)    Protein, ur 30 (*)    Bacteria, UA RARE (*)    All other components within normal limits  LIPASE, BLOOD  CBC WITH DIFFERENTIAL/PLATELET  MAGNESIUM  HCG, SERUM, QUALITATIVE    EKG None  Radiology No results found.  Procedures Procedures (including critical care time)  Medications Ordered in ED Medications  sodium chloride 0.9 % bolus 1,000 mL (has no administration in time range)  promethazine (PHENERGAN) injection 25 mg (has no administration in time range)  famotidine (PEPCID) IVPB 20 mg premix (has no administration in time range)  morphine 4 MG/ML injection 4 mg (has no administration in time range)     Initial Impression / Assessment and Plan / ED Course  I have reviewed the triage vital signs and the nursing notes.  Pertinent labs & imaging results that were available during my care of the patient were reviewed by me and considered in my medical decision making (see chart for details).  Clinical Course as of Jun 26 1312  Fri Jun 25, 2018  2046 Patient's lab work is been fairly unremarkable other than a mildly low potassium 3.1.  She is resting  comfortably and says her symptoms are somewhat improved.   [MB]  2209 She is been able to tolerate a little cranberry juice here.   [MB]  2257 On review of her prior visits patient has been experiencing symptoms similar to this since 2016.  I do not feel she has any indication for admission.  She will need to follow-up with her GI doctor for continued management of her symptoms.   [MB]    Clinical Course User Index [MB] Terrilee Files, MD      Final Clinical Impressions(s) / ED Diagnoses   Final diagnoses:  Generalized abdominal pain  Non-intractable vomiting with nausea, unspecified vomiting type  Hypokalemia    ED Discharge Orders         Ordered    omeprazole (PRILOSEC) 20 MG capsule  Daily     06/25/18 2154    ondansetron (ZOFRAN) 4 MG tablet  Every 6 hours  06/25/18 2154           Terrilee Files, MD 06/26/18 1314

## 2018-07-14 ENCOUNTER — Emergency Department (HOSPITAL_BASED_OUTPATIENT_CLINIC_OR_DEPARTMENT_OTHER)
Admission: EM | Admit: 2018-07-14 | Discharge: 2018-07-14 | Disposition: A | Discharge status: Home / Self Care | Payer: 59 | Attending: Emergency Medicine | Admitting: Emergency Medicine

## 2018-07-14 ENCOUNTER — Encounter (HOSPITAL_BASED_OUTPATIENT_CLINIC_OR_DEPARTMENT_OTHER): Payer: Self-pay | Admitting: *Deleted

## 2018-07-14 ENCOUNTER — Emergency Department (HOSPITAL_BASED_OUTPATIENT_CLINIC_OR_DEPARTMENT_OTHER): Payer: 59

## 2018-07-14 ENCOUNTER — Other Ambulatory Visit: Payer: Self-pay

## 2018-07-14 DIAGNOSIS — R079 Chest pain, unspecified: Secondary | ICD-10-CM | POA: Diagnosis not present

## 2018-07-14 DIAGNOSIS — I10 Essential (primary) hypertension: Secondary | ICD-10-CM | POA: Insufficient documentation

## 2018-07-14 DIAGNOSIS — Z79899 Other long term (current) drug therapy: Secondary | ICD-10-CM | POA: Insufficient documentation

## 2018-07-14 LAB — CBC WITH DIFFERENTIAL/PLATELET
ABS IMMATURE GRANULOCYTES: 0.01 10*3/uL (ref 0.00–0.07)
BASOS PCT: 0 %
Basophils Absolute: 0 10*3/uL (ref 0.0–0.1)
Eosinophils Absolute: 0.1 10*3/uL (ref 0.0–0.5)
Eosinophils Relative: 3 %
HCT: 36 % (ref 36.0–46.0)
Hemoglobin: 12.1 g/dL (ref 12.0–15.0)
IMMATURE GRANULOCYTES: 0 %
LYMPHS PCT: 37 %
Lymphs Abs: 1.8 10*3/uL (ref 0.7–4.0)
MCH: 31.2 pg (ref 26.0–34.0)
MCHC: 33.6 g/dL (ref 30.0–36.0)
MCV: 92.8 fL (ref 80.0–100.0)
Monocytes Absolute: 0.6 10*3/uL (ref 0.1–1.0)
Monocytes Relative: 12 %
NEUTROS ABS: 2.4 10*3/uL (ref 1.7–7.7)
NEUTROS PCT: 48 %
PLATELETS: 321 10*3/uL (ref 150–400)
RBC: 3.88 MIL/uL (ref 3.87–5.11)
RDW: 12.3 % (ref 11.5–15.5)
WBC: 4.9 10*3/uL (ref 4.0–10.5)
nRBC: 0 % (ref 0.0–0.2)

## 2018-07-14 LAB — TROPONIN I: Troponin I: <0.03 ng/mL (ref <0.03)

## 2018-07-14 LAB — PREGNANCY, URINE: PREG TEST UR: NEGATIVE

## 2018-07-14 LAB — COMPREHENSIVE METABOLIC PANEL
ALBUMIN: 3.8 g/dL (ref 3.5–5.0)
ALT: 15 U/L (ref 0–44)
AST: 17 U/L (ref 15–41)
Alkaline Phosphatase: 43 U/L (ref 38–126)
BILIRUBIN TOTAL: 0.4 mg/dL (ref 0.3–1.2)
BUN: 9 mg/dL (ref 6–20)
CHLORIDE: 108 mmol/L (ref 98–111)
CO2: 26 mmol/L (ref 22–32)
Calcium: 9 mg/dL (ref 8.9–10.3)
Creatinine, Ser: 0.9 mg/dL (ref 0.44–1.00)
GFR calc Af Amer: >60 mL/min (ref >60)
GFR calc non Af Amer: >60 mL/min (ref >60)
GLUCOSE: 87 mg/dL (ref 70–99)
POTASSIUM: 3.5 mmol/L (ref 3.5–5.1)
SODIUM: 136 mmol/L (ref 135–145)
TOTAL PROTEIN: 6.9 g/dL (ref 6.5–8.1)

## 2018-07-14 LAB — D-DIMER, QUANTITATIVE (NOT AT ARMC): D DIMER QUANT: 0.36 ug{FEU}/mL (ref 0.00–0.50)

## 2018-07-14 MED ORDER — SODIUM CHLORIDE 0.9 % IV BOLUS
1000.0000 mL | Freq: Once | INTRAVENOUS | Status: AC
  Administered 2018-07-14: 1000 mL via INTRAVENOUS

## 2018-07-14 MED ORDER — KETOROLAC TROMETHAMINE 30 MG/ML IJ SOLN
30.0000 mg | Freq: Once | INTRAMUSCULAR | Status: AC
  Administered 2018-07-14: 30 mg via INTRAVENOUS
  Filled 2018-07-14: qty 1

## 2018-07-14 NOTE — ED Notes (Signed)
Pt here for chest pain. States she almost always has epigastric pain, but the chest pain is what brought her to the ED. Pt c/o central chest pain that started this morning after waking up. She had several episodes of vomiting today, and this afternoon began having left arm pain with hand cramping.

## 2018-07-14 NOTE — ED Notes (Signed)
Pt verbalizes understanding of d/c instructions and denies any further needs at this time. 

## 2018-07-14 NOTE — ED Provider Notes (Signed)
[facility] EMERGENCY DEPARTMENT Provider Note   CSN: [id number] Arrival date & time: 07/14/18  1808     History   Chief Complaint Chief Complaint  Patient presents with  . Chest Pain    HPI Chelsey Hall is a 27 y.o. female past medical history of headache, hypertension, mediastinal cyst, pericarditis who presents for evaluation of chest pain that began about 1 PM this evening.  Patient reports that she has been sick recently some nausea/vomiting.  Patient reports that about 1 PM, she started developing pain in the left side of her chest.  Patient reports that when she got the pain, she got very sweaty.  She states that then she started experiencing numbness and tingling to the tips of her fingers bilaterally.  Patient reports that she tried walking around but states that the pain continued.  Eventually, the pain started radiating to her left upper extremity.  Patient reports she did not take any medications for the pain.  Patient states that the pain was worse with deep inspiration.  Patient reports that she called her mom who prompted her to come to the emergency department for further evaluation.  Patient does report that she smokes black and milds and does report occasional marijuana use.  Her last marijuana use was 1 week ago.  Patient denies any IV drug use, cocaine use.  She has no personal cardiac history.  She states that her maternal grandfather had a heart attack at 3.  She also reports that there is some cardiovascular history in her maternal uncle.  Patient does have a history of hypertension but denies any history of diabetes.  Patient reports that she recently was on a flight to and from [place] about a week ago.  Patient denies any leg swelling, hospitalization, surgeries, history of blood clots in her legs or her lungs, history of OCPs or exogenous hormones.  The history is provided by the patient.    Past Medical History:  Diagnosis Date  . Head ache   .  Hypertension   . Mass of heart   . 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 Pericarditis     There are no active problems to display for this patient.   Past Surgical History:  Procedure Laterality Date  . APPENDECTOMY       OB History   None      Home Medications    Prior to Admission medications   Medication Sig Start Date End Date Taking? Authorizing Provider  lisinopril-hydrochlorothiazide (PRINZIDE,ZESTORETIC) 20-12.5 MG tablet Take 1 tablet by mouth daily. 05/27/18   [provider]  LORazepam (ATIVAN) 0.5 MG tablet Take 1 tablet (0.5 mg total) by mouth every 8 (eight) hours as needed for anxiety (nausea). Patient not taking: Reported on 05/22/2018 03/04/18   Tilden Fossa, MD  omeprazole (PRILOSEC) 20 MG capsule Take 1 capsule (20 mg total) by mouth daily. 06/25/18   Terrilee Files, MD  ondansetron (ZOFRAN) 4 MG tablet Take 1 tablet (4 mg total) by mouth every 6 (six) hours. 06/25/18   Terrilee Files, MD  promethazine (PHENERGAN) 25 MG suppository Place 1 suppository (25 mg total) rectally every 6 (six) hours as needed for nausea or vomiting. Patient not taking: Reported on 05/22/2018 03/01/18   Jaynie Crumble, PA-C  promethazine (PHENERGAN) 25 MG tablet Take 25 mg by mouth every 8 (eight) hours as needed for nausea or vomiting.  05/27/18   [provider]    Family History Family History  Problem Relation Age of Onset  . Hypertension Mother   . CAD Father     Social History Social History   Tobacco Use  . Smoking status: Never Smoker  . Smokeless tobacco: Never Used  Substance Use Topics  . Alcohol use: No    Frequency: Never  . Drug use: No    Types: Marijuana     Allergies   Codeine   Review of Systems Review of Systems  Constitutional: Negative for fever.  Respiratory: Negative for cough and shortness of breath.   Cardiovascular: Positive for chest pain. Negative for leg swelling.  Gastrointestinal: Positive for  nausea. Negative for abdominal pain and vomiting.  Genitourinary: Negative for dysuria and hematuria.  Neurological: Negative for weakness, numbness and headaches.  All other systems reviewed and are negative.    Physical Exam Updated Vital Signs BP 126/87 (BP Location: Left Arm)   Pulse 79   Temp 99.6 F (37.6 C) (Oral)   Resp 18   Ht 5\' 8"  (1.727 m)   Wt 80.3 kg   LMP 06/20/2018   SpO2 100%   BMI 26.91 kg/m   Physical Exam  Constitutional: She is oriented to person, place, and time. She appears well-developed and well-nourished.  Sitting comfortably on examination table  HENT:  Head: Normocephalic and atraumatic.  Mouth/Throat: Oropharynx is clear and moist and mucous membranes are normal.  Eyes: Pupils are equal, round, and reactive to light. Conjunctivae, EOM and lids are normal.  Neck: Full passive range of motion without pain.  Cardiovascular: Normal rate, regular rhythm, normal heart sounds and normal pulses. Exam reveals no gallop and no friction rub.  No murmur heard. Pulses:      Radial pulses are 2+ on the right side, and 2+ on the left side.       Dorsalis pedis pulses are 2+ on the right side, and 2+ on the left side.  Pulmonary/Chest: Effort normal and breath sounds normal.  Lungs clear to auscultation bilaterally.  Symmetric chest rise.  No wheezing, rales, rhonchi.  No evidence of decreased breath sounds.  Pain is reproduced with palpation and movement of the left upper extremity.  Abdominal: Soft. Normal appearance. There is no tenderness. There is no rigidity and no guarding.  Abdomen is soft, non-distended, non-tender. No rigidity, No guarding. No peritoneal signs.  Musculoskeletal: Normal range of motion.  Neurological: She is alert and oriented to person, place, and time.  Cranial nerves III-XII intact Follows commands, Moves all extremities  5/5 strength to BUE and BLE  Sensation intact throughout all major nerve distributions Normal coordination No  pronator drift. No gait abnormalities  No slurred speech. No facial droop.   Skin: Skin is warm and dry. Capillary refill takes less than 2 seconds.  Psychiatric: She has a normal mood and affect. Her speech is normal.  Nursing note and vitals reviewed.    ED Treatments / Results  Labs (all labs ordered are listed, but only abnormal results are displayed) Labs Reviewed  COMPREHENSIVE METABOLIC PANEL - Abnormal; Notable for the following components:      Result Value   Anion gap <3 (*)    All other components within normal limits  CBC WITH DIFFERENTIAL/PLATELET  D-DIMER, QUANTITATIVE (NOT AT [facility])  TROPONIN I  PREGNANCY, URINE  TROPONIN I    EKG EKG Interpretation  Date/Time:  Wednesday July 14 2018 18:22:36 EST Ventricular Rate:  119 PR Interval:    QRS Duration: 70 QT Interval:  327 QTC Calculation: 461  R Axis:   -66 Text Interpretation:  Sinus tachycardia Left anterior fascicular block Confirmed by Geoffery Lyons ([id number]) on 07/14/2018 7:01:55 PM   Radiology Dg Chest 2 View  Result Date: 07/14/2018 CLINICAL DATA:  Mid chest pain. EXAM: CHEST - 2 VIEW COMPARISON:  May 25, 2018 FINDINGS: The heart size and mediastinal contours are within normal limits. Both lungs are clear. The visualized skeletal structures are unremarkable. IMPRESSION: No active cardiopulmonary disease. Electronically Signed   By: Gerome Sam III M.D   On: 07/14/2018 19:43    Procedures Procedures (including critical care time)  Medications Ordered in ED Medications  sodium chloride 0.9 % bolus 1,000 mL (0 mLs Intravenous Stopped 07/14/18 2143)  ketorolac (TORADOL) 30 MG/ML injection 30 mg (30 mg Intravenous Given 07/14/18 1927)     Initial Impression / Assessment and Plan / ED Course  I have reviewed the triage vital signs and the nursing notes.  Pertinent labs & imaging results that were available during my care of the patient were reviewed by me and considered in my medical  decision making (see chart for details).     27 year old female who presents for evaluation of chest pain that began approximately 1 PM.  Patient reports he has been sick over the last 2 days with some generalized abdominal pain, nausea/vomiting.  She does report that she would have the symptoms occasionally and states that this is not new for her.  Chest pain began approximately 1 PM.  Patient states that she called her mom who prompted her to come to the emergency department for further evaluation.  His pain was worse with deep inspiration.  She reports pain is improved since coming to the ED.  She also reports numbness to the bilateral fingertips and pain in the left upper extremity. Patient is afebrile, non-toxic appearing, sitting comfortably on examination table. Vital signs reviewed and stable.  Consider anxiety versus musculoskeletal pain versus infectious etiology.  Low suspicion for ACS etiology given age, lack of risk factors, history/physical exam but also consideration.  Additionally, since patient is reporting pleuritic chest pain, will consider PE.  Fortunately, patient cannot be PERC negative since she is tachycardic and has a history of recent travel.  History/physical exam is not concerning for dissection or Boerhaave syndrome.  We will plan to check basic labs.  CBC without any significant leukocytosis or anemia.  CMP is unremarkable.  Initial troponin negative.  Urine pregnancy negative.  D-dimer negative.  Chest x-ray negative for any acute infectious etiology.  No other acute abnormality seen on chest x-ray.  Discussed results with patient.  She reports improvement in pain after analgesics.  Vital signs reviewed and improved after analgesics.  We will plan to delta troponin.  Given patient's history/risk factors, she is a heart score of 1.  We will plan for delta troponin.  Repeat troponin negative.  I discussed results with patient.  She reports improvement in pain since being here  in the ED. Vital signs are stable. At this time, patient exhibits no emergent life-threatening condition that require further evaluation in ED or admission.  Encourage at home supportive care measures.  Patient encouraged to follow-up with primary care or follow-up with [facility] to establish primary care. Patient had ample opportunity for questions and discussion. All patient's questions were answered with full understanding. Strict return precautions discussed. Patient expresses understanding and agreement to plan.   Final Clinical Impressions(s) / ED Diagnoses   Final diagnoses:  Nonspecific chest pain  ED Discharge Orders    None       Rosana Hoes 07/15/18 0006    Geoffery Lyons, MD 07/15/18 1504

## 2018-07-14 NOTE — ED Triage Notes (Addendum)
Pt c/o cont abd pain x 1 week. [facility] , pt requesting work note

## 2018-07-14 NOTE — Discharge Instructions (Signed)
You can take Tylenol or Ibuprofen as directed for pain. You can alternate Tylenol and Ibuprofen every 4 hours. If you take Tylenol at 1pm, then you can take Ibuprofen at 5pm. Then you can take Tylenol again at 9pm.   Your primary care doctor next 2 to 4 days.  If you do not have a primary care doctor, you can use the [facility] to establish primary care.  Return to the Emergency Department immediately if you experiencing worsening chest pain, difficulty breathing, nausea/vomiting, get very sweaty, headache or any other worsening or concerning symptoms.

## 2018-07-14 NOTE — ED Notes (Signed)
Pt states she takes BP medication, but she vomited it up today

## 2019-01-10 ENCOUNTER — Emergency Department (HOSPITAL_BASED_OUTPATIENT_CLINIC_OR_DEPARTMENT_OTHER)
Admission: EM | Admit: 2019-01-10 | Discharge: 2019-01-10 | Disposition: A | Discharge status: Home / Self Care | Payer: 59 | Attending: Emergency Medicine | Admitting: Emergency Medicine

## 2019-01-10 ENCOUNTER — Emergency Department (HOSPITAL_BASED_OUTPATIENT_CLINIC_OR_DEPARTMENT_OTHER): Payer: 59

## 2019-01-10 ENCOUNTER — Other Ambulatory Visit: Payer: Self-pay

## 2019-01-10 ENCOUNTER — Encounter (HOSPITAL_BASED_OUTPATIENT_CLINIC_OR_DEPARTMENT_OTHER): Payer: Self-pay | Admitting: *Deleted

## 2019-01-10 DIAGNOSIS — Z79899 Other long term (current) drug therapy: Secondary | ICD-10-CM | POA: Insufficient documentation

## 2019-01-10 DIAGNOSIS — R112 Nausea with vomiting, unspecified: Secondary | ICD-10-CM

## 2019-01-10 DIAGNOSIS — R55 Syncope and collapse: Secondary | ICD-10-CM | POA: Diagnosis not present

## 2019-01-10 DIAGNOSIS — Z20828 Contact with and (suspected) exposure to other viral communicable diseases: Secondary | ICD-10-CM | POA: Insufficient documentation

## 2019-01-10 DIAGNOSIS — R05 Cough: Secondary | ICD-10-CM | POA: Insufficient documentation

## 2019-01-10 DIAGNOSIS — I10 Essential (primary) hypertension: Secondary | ICD-10-CM | POA: Insufficient documentation

## 2019-01-10 DIAGNOSIS — R0602 Shortness of breath: Secondary | ICD-10-CM | POA: Diagnosis not present

## 2019-01-10 LAB — CBC WITH DIFFERENTIAL/PLATELET
Abs Immature Granulocytes: 0.01 10*3/uL (ref 0.00–0.07)
Basophils Absolute: 0 10*3/uL (ref 0.0–0.1)
Basophils Relative: 0 %
Eosinophils Absolute: 0 10*3/uL (ref 0.0–0.5)
Eosinophils Relative: 1 %
HCT: 37.8 % (ref 36.0–46.0)
Hemoglobin: 12.8 g/dL (ref 12.0–15.0)
Immature Granulocytes: 0 %
Lymphocytes Relative: 23 %
Lymphs Abs: 1.2 10*3/uL (ref 0.7–4.0)
MCH: 31 pg (ref 26.0–34.0)
MCHC: 33.9 g/dL (ref 30.0–36.0)
MCV: 91.5 fL (ref 80.0–100.0)
Monocytes Absolute: 0.5 10*3/uL (ref 0.1–1.0)
Monocytes Relative: 9 %
Neutro Abs: 3.6 10*3/uL (ref 1.7–7.7)
Neutrophils Relative %: 67 %
Platelets: 236 10*3/uL (ref 150–400)
RBC: 4.13 MIL/uL (ref 3.87–5.11)
RDW: 12.1 % (ref 11.5–15.5)
WBC: 5.4 10*3/uL (ref 4.0–10.5)
nRBC: 0 % (ref 0.0–0.2)

## 2019-01-10 LAB — COMPREHENSIVE METABOLIC PANEL
ALT: 17 U/L (ref 0–44)
AST: 15 U/L (ref 15–41)
Albumin: 4.2 g/dL (ref 3.5–5.0)
Alkaline Phosphatase: 52 U/L (ref 38–126)
Anion gap: 8 (ref 5–15)
BUN: 11 mg/dL (ref 6–20)
CO2: 23 mmol/L (ref 22–32)
Calcium: 9.4 mg/dL (ref 8.9–10.3)
Chloride: 107 mmol/L (ref 98–111)
Creatinine, Ser: 0.67 mg/dL (ref 0.44–1.00)
GFR calc Af Amer: >60 mL/min (ref >60)
GFR calc non Af Amer: >60 mL/min (ref >60)
Glucose, Bld: 116 mg/dL — ABNORMAL HIGH (ref 70–99)
Potassium: 3.2 mmol/L — ABNORMAL LOW (ref 3.5–5.1)
Sodium: 138 mmol/L (ref 135–145)
Total Bilirubin: 0.6 mg/dL (ref 0.3–1.2)
Total Protein: 7.8 g/dL (ref 6.5–8.1)

## 2019-01-10 LAB — SARS CORONAVIRUS 2 AG (30 MIN TAT): SARS Coronavirus 2 Ag: NEGATIVE

## 2019-01-10 LAB — LIPASE, BLOOD: Lipase: 32 U/L (ref 11–51)

## 2019-01-10 LAB — TROPONIN I: Troponin I: <0.03 ng/mL (ref <0.03)

## 2019-01-10 LAB — PREGNANCY, URINE: Preg Test, Ur: NEGATIVE

## 2019-01-10 MED ORDER — POTASSIUM CHLORIDE CRYS ER 20 MEQ PO TBCR
40.0000 meq | EXTENDED_RELEASE_TABLET | Freq: Once | ORAL | Status: AC
  Administered 2019-01-10: 14:00:00 40 meq via ORAL
  Filled 2019-01-10: qty 2

## 2019-01-10 MED ORDER — AEROCHAMBER PLUS FLO-VU MEDIUM MISC
1.0000 | Freq: Once | Status: AC
  Administered 2019-01-10: 14:00:00 1
  Filled 2019-01-10: qty 1

## 2019-01-10 MED ORDER — ALBUTEROL SULFATE HFA 108 (90 BASE) MCG/ACT IN AERS
10.0000 | INHALATION_SPRAY | Freq: Once | RESPIRATORY_TRACT | Status: AC
  Administered 2019-01-10: 14:00:00 10 via RESPIRATORY_TRACT
  Filled 2019-01-10: qty 6.7

## 2019-01-10 MED ORDER — IOHEXOL 350 MG/ML SOLN
100.0000 mL | Freq: Once | INTRAVENOUS | Status: AC | PRN
  Administered 2019-01-10: 14:00:00 100 mL via INTRAVENOUS

## 2019-01-10 MED ORDER — ONDANSETRON HCL 4 MG/2ML IJ SOLN
4.0000 mg | Freq: Once | INTRAMUSCULAR | Status: AC
  Administered 2019-01-10: 4 mg via INTRAVENOUS
  Filled 2019-01-10: qty 2

## 2019-01-10 MED ORDER — SODIUM CHLORIDE 0.9 % IV BOLUS
1000.0000 mL | Freq: Once | INTRAVENOUS | Status: AC
  Administered 2019-01-10: 13:00:00 1000 mL via INTRAVENOUS

## 2019-01-10 NOTE — ED Triage Notes (Signed)
She has been treated for pneumonia since March. She just completed her second round of antibiotics. She has had 2 negative Covid19 test. Here today with nausea.

## 2019-01-10 NOTE — Discharge Instructions (Addendum)
Please see the information and instructions below regarding your visit.  Your diagnoses today include:  1. Non-intractable vomiting with nausea, unspecified vomiting type   2. Near syncope     Tests performed today include: See side panel of your discharge paperwork for testing performed today. Vital signs are listed at the bottom of these instructions.   On testing today, your potassium level was 3.2 which is slightly below normal. I suspect this is due to vomiting. Increasing potassium in your diet should be sufficient to make sure you are getting enough potassium. Below is a list of good sources and servings high in potassium (in order from greatest to least):  -1 cup cooked acorn squash -1 baked potato with skin -1 cup cooked spinach -1 cup cooked lentils -1 cup cooked kidney beans -Watermelon - cup raisins -1 cup plain yogurt -1 cup orange juice, frozen -Banana -1 cup 1% low-fat milk -1 cup cooked broccoli   Medications prescribed:    Take any prescribed medications only as prescribed, and any over the counter medications only as directed on the packaging.  Please take 2 puffs of your albuterol inhaler with the spacer every 6 hours over the next 3 days.  Home care instructions:  Please follow any educational materials contained in this packet.   Follow-up instructions: Please follow-up with your primary care provider in 48 hours for further evaluation of your symptoms if they are not completely improved.   Return instructions:  Please return to the Emergency Department if you experience worsening symptoms.  Please come back to the emergency department if you develop any worsening shortness of breath, passing out spells, chest pain, or nausea vomiting prevention from keeping anything down. Please return if you have any other emergent concerns.  Additional Information:   Your vital signs today were: BP (!) 137/98 (BP Location: Right Arm)    Pulse 92    Temp 99.3 F  (37.4 C) (Oral)    Resp 18    Ht 5\' 9"  (1.753 m)    Wt 85.7 kg    LMP 12/30/2018    SpO2 95%    BMI 27.91 kg/m  If your blood pressure (BP) was elevated on multiple readings during this visit above 130 for the top number or above 80 for the bottom number, please have this repeated by your primary care provider within one month. --------------  Thank you for allowing [place] to participate in your care today.

## 2019-01-10 NOTE — ED Notes (Signed)
Ginger Ale given for PO challenge.

## 2019-01-10 NOTE — ED Provider Notes (Signed)
[facility] EMERGENCY DEPARTMENT Provider Note   CSN: [id number] Arrival date & time: 01/10/19  1153    History   Chief Complaint Chief Complaint  Patient presents with   Nausea    HPI Chelsey Hall is a 28 y.o. female.     HPI  Patient is a 28 year old female with a past medical history of hypertension, pericardial cyst, status post removal in 2017, pancreatitis due to biliary obstruction presenting for nausea and near syncope.  Patient reports that over the past several weeks she has been recovering from a respiratory illness.  She reports that in April 2020, she was diagnosed with community-acquired pneumonia of right lower lobe primary care provider treated her with 2 different antibiotics which she does not recall the name, but she believes one is azithromycin.  Patient reports that she deals with chronic nausea and vomiting due to "chronic pancreatitis" and is prescribed Zofran as a home medication.  Patient reports that after several weeks out of her work for the [employer] she was supposed to return to work today and when she walked across the parking lot into work she felt ill and lightheaded.  Patient reports that she went back to her car, sat down, and came to the emergency department.  She did not syncopize.  She reports she has had shortness of breath for weeks now since her illness but she does not have any increase in her symptoms today.  Denies chest pain.  Denies bilious or bloody vomiting.  Denies tarry stools or bloody stools.  Patient denies any history of myocarditis.  No history DVT/PE, hormone use, cancer treatment, recent mobilization, hospitalization, recent surgery, lower extremity edema or calf tenderness or hemoptysis.  Patient reports that she has had 2- COVID-19 tests.  She has had no known positive exposures but at the onset of her illness there were several individuals at the post office with respiratory illness.  Her son also is being evaluated for  febrile respiratory illness currently.  Past Medical History:  Diagnosis Date   Head ache    Hypertension    Mass of heart    Pancreatitis due to biliary obstruction    Pericarditis     There are no active problems to display for this patient.   Past Surgical History:  Procedure Laterality Date   APPENDECTOMY       OB History   No obstetric history on file.      Home Medications    Prior to Admission medications   Medication Sig Start Date End Date Taking? Authorizing Provider  lisinopril-hydrochlorothiazide (PRINZIDE,ZESTORETIC) 20-12.5 MG tablet Take 1 tablet by mouth daily. 05/27/18  Yes [provider]  LORazepam (ATIVAN) 0.5 MG tablet Take 1 tablet (0.5 mg total) by mouth every 8 (eight) hours as needed for anxiety (nausea). 03/04/18  Yes Tilden Fossaees, Elizabeth, MD  omeprazole (PRILOSEC) 20 MG capsule Take 1 capsule (20 mg total) by mouth daily. 06/25/18  Yes Terrilee FilesButler, Michael C, MD  ondansetron (ZOFRAN) 4 MG tablet Take 1 tablet (4 mg total) by mouth every 6 (six) hours. 06/25/18  Yes Terrilee FilesButler, Michael C, MD  promethazine (PHENERGAN) 25 MG suppository Place 1 suppository (25 mg total) rectally every 6 (six) hours as needed for nausea or vomiting. 03/01/18  Yes Kirichenko, Tatyana, PA-C  promethazine (PHENERGAN) 25 MG tablet Take 25 mg by mouth every 8 (eight) hours as needed for nausea or vomiting.  05/27/18  Yes [provider]    Family History Family History  Problem Relation Age of Onset   Hypertension Mother    CAD Father     Social History Social History   Tobacco Use   Smoking status: Never Smoker   Smokeless tobacco: Never Used  Substance Use Topics   Alcohol use: No    Frequency: Never   Drug use: No    Types: Marijuana     Allergies   Codeine   Review of Systems Review of Systems  Constitutional: Positive for chills. Negative for fever.  HENT: Positive for rhinorrhea and sinus pain. Negative for congestion and sore throat.     Eyes: Negative for visual disturbance.  Respiratory: Positive for cough and shortness of breath. Negative for chest tightness.   Cardiovascular: Negative for chest pain, palpitations and leg swelling.  Gastrointestinal: Positive for nausea and vomiting. Negative for abdominal pain and diarrhea.  Genitourinary: Negative for dysuria and flank pain.  Musculoskeletal: Negative for back pain and myalgias.  Skin: Negative for rash.  Neurological: Positive for light-headedness. Negative for dizziness, syncope and headaches.     Physical Exam Updated Vital Signs BP (!) 140/95 (BP Location: Right Arm)    Pulse (!) 108    Temp 99.3 F (37.4 C) (Oral)    Resp 18    Ht 5\' 9"  (1.753 m)    Wt 85.7 kg    LMP 12/30/2018    SpO2 99%    BMI 27.91 kg/m   Physical Exam Vitals signs and nursing note reviewed.  Constitutional:      General: She is not in acute distress.    Appearance: She is well-developed.  HENT:     Head: Normocephalic and atraumatic.     Mouth/Throat:     Mouth: Mucous membranes are moist.     Pharynx: No oropharyngeal exudate or posterior oropharyngeal erythema.  Eyes:     Conjunctiva/sclera: Conjunctivae normal.     Pupils: Pupils are equal, round, and reactive to light.  Neck:     Musculoskeletal: Normal range of motion and neck supple.  Cardiovascular:     Rate and Rhythm: Normal rate and regular rhythm.     Heart sounds: S1 normal and S2 normal. No murmur.  Pulmonary:     Effort: Pulmonary effort is normal.     Breath sounds: Rhonchi present. No wheezing or rales.     Comments: Rhonchi in upper lung fields clear with cough.  Able to speak in full sentences.  Abdominal:     General: There is no distension.     Palpations: Abdomen is soft.     Tenderness: There is no abdominal tenderness. There is no guarding.  Musculoskeletal: Normal range of motion.        General: No deformity.  Lymphadenopathy:     Cervical: No cervical adenopathy.  Skin:    General: Skin is  warm and dry.     Findings: No erythema or rash.  Neurological:     Mental Status: She is alert.     Comments: Cranial nerves grossly intact. Patient moves extremities symmetrically and with good coordination.  Psychiatric:        Behavior: Behavior normal.        Thought Content: Thought content normal.        Judgment: Judgment normal.      ED Treatments / Results  Labs (all labs ordered are listed, but only abnormal results are displayed) Labs Reviewed  COMPREHENSIVE METABOLIC PANEL - Abnormal; Notable for the following components:      Result  Value   Potassium 3.2 (*)    Glucose, Bld 116 (*)    All other components within normal limits  SARS CORONAVIRUS 2 (HOSP ORDER, PERFORMED IN [facility] VIA ABBOTT ID)  CBC WITH DIFFERENTIAL/PLATELET  TROPONIN I  PREGNANCY, URINE  LIPASE, BLOOD    EKG EKG Interpretation  Date/Time:  Monday Jan 10 2019 12:53:02 EDT Ventricular Rate:  91 PR Interval:    QRS Duration: 80 QT Interval:  373 QTC Calculation: 459 R Axis:   -60 Text Interpretation:  Sinus rhythm Probable left atrial enlargement Left anterior fascicular block Borderline T abnormalities, inferior leads Confirmed by Benjiman Core [phone]) on 01/10/2019 3:50:59 PM   Radiology Ct Angio Chest Pe W/cm &/or Wo Cm  Result Date: 01/10/2019 CLINICAL DATA:  Cough and congestion. Recent pneumonia. Multiple negative COVID tests. EXAM: CT ANGIOGRAPHY CHEST WITH CONTRAST TECHNIQUE: Multidetector CT imaging of the chest was performed using the standard protocol during bolus administration of intravenous contrast. Multiplanar CT image reconstructions and MIPs were obtained to evaluate the vascular anatomy. CONTRAST:  OMNIPAQUE IOHEXOL 350 MG/ML SOLN COMPARISON:  Two-view chest x-ray 12/10/2018 FINDINGS: Cardiovascular: Heart size is normal. Aorta and great vessels are within normal limits. Pulmonary artery opacification is excellent. There are no focal filling defects to  suggest pulmonary embolus. Mediastinum/Nodes: No significant mediastinal, hilar, or axillary adenopathy is present. Lungs/Pleura: Lungs are clear without focal nodule, mass, or airspace disease. There is no pleural effusion. No pneumothorax is present. Upper Abdomen: There is diffuse fatty infiltration of the liver. No discrete lesions are present. Limited imaging of the upper abdomen is otherwise unremarkable. Musculoskeletal: Vertebral body heights and alignment are maintained. No focal lytic or blastic lesions are present. Ribs are unremarkable. Review of the MIP images confirms the above findings. IMPRESSION: 1. No pulmonary embolus. 2. Negative CT of the chest. No acute or focal lung disease to explain cough or congestion. Electronically Signed   By: Marin Roberts M.D.   On: 01/10/2019 14:17    Procedures Procedures (including critical care time)  Medications Ordered in ED Medications  sodium chloride 0.9 % bolus 1,000 mL (0 mLs Intravenous Stopped 01/10/19 1418)  albuterol (VENTOLIN HFA) 108 (90 Base) MCG/ACT inhaler 10 puff (10 puffs Inhalation Given 01/10/19 1349)  AeroChamber Plus Flo-Vu Medium MISC 1 each (1 each Other Given 01/10/19 1349)  ondansetron (ZOFRAN) injection 4 mg (4 mg Intravenous Given 01/10/19 1336)  iohexol (OMNIPAQUE) 350 MG/ML injection 100 mL (100 mLs Intravenous Contrast Given 01/10/19 1411)  potassium chloride SA (K-DUR) CR tablet 40 mEq (40 mEq Oral Given 01/10/19 1419)     Initial Impression / Assessment and Plan / ED Course  I have reviewed the triage vital signs and the nursing notes.  Pertinent labs & imaging results that were available during my care of the patient were reviewed by me and considered in my medical decision making (see chart for details).        Patient is a 28 year old female with past medical history of pericardial cyst, chronic pancreatitis per patient, cyclical nausea/vomiting presenting for nausea and near syncope.  Differential  diagnosis includes volume depletion secondary to chronic nausea vomiting, gastroenteritis, myocarditis, pericarditis, ongoing pneumonia, viral respiratory illness including COVID-19.  History not consistent with cardiogenic syncope.  Work-up demonstrating a normal CBC.  CMP demonstrating potassium of 3.2, repleted.  Troponin is normal therefore doubt myocarditis.  EKG normal sinus rhythm without evidence of acute ischemia, infarction, or arrhythmia, reviewed by me.  Does not appear consistent  with cardiomyopathy.  CT angiogram performed given initial tachycardia, shortness of breath, and possible COVID-19 infection with hypercoagulability.  This demonstrated no evidence of pulmonary embolism or other acute abnormality in the lungs.  Patient given fluids, antiemetics.  Patient passing p.o. challenge without difficulty.  Suspect general debilitation from ongoing respiratory illness versus volume depletion from nausea and vomiting.  Patient feels well for discharge and follow-up with primary care.  Patient given additional 48 hours out of work and recommended to follow-up with primary care provider before going back.  All objective evidence suggest that patient is recovering from her respiratory illness and she has 3rd negative SARS-CoV-2 test today.  Patient is given return precautions for any syncope, shortness of breath, chest pain, or intractable nausea or vomiting.  Patient is in understanding and agrees with plan of care.  Final Clinical Impressions(s) / ED Diagnoses   Final diagnoses:  Non-intractable vomiting with nausea, unspecified vomiting type  Near syncope    ED Discharge Orders    None       Delia Chimes 01/10/19 1658    Benjiman Core, MD 01/11/19 [phone]

## 2019-01-10 NOTE — ED Notes (Signed)
Cont' to encourage Pt. To keep mask over her nose as she coughs and has been treated for pneumonia x 2 in the past several weeks.  Pt. Actively coughing in room.

## 2019-07-10 IMAGING — DX DG CHEST 1V PORT
1 series · 1 of 1 positions shown · non-contrast
Comparison: Radiographs 05/29/2017, CT 06/01/2017

CLINICAL DATA: Mid chest pain.

EXAM:
PORTABLE CHEST 1 VIEW

[chest]
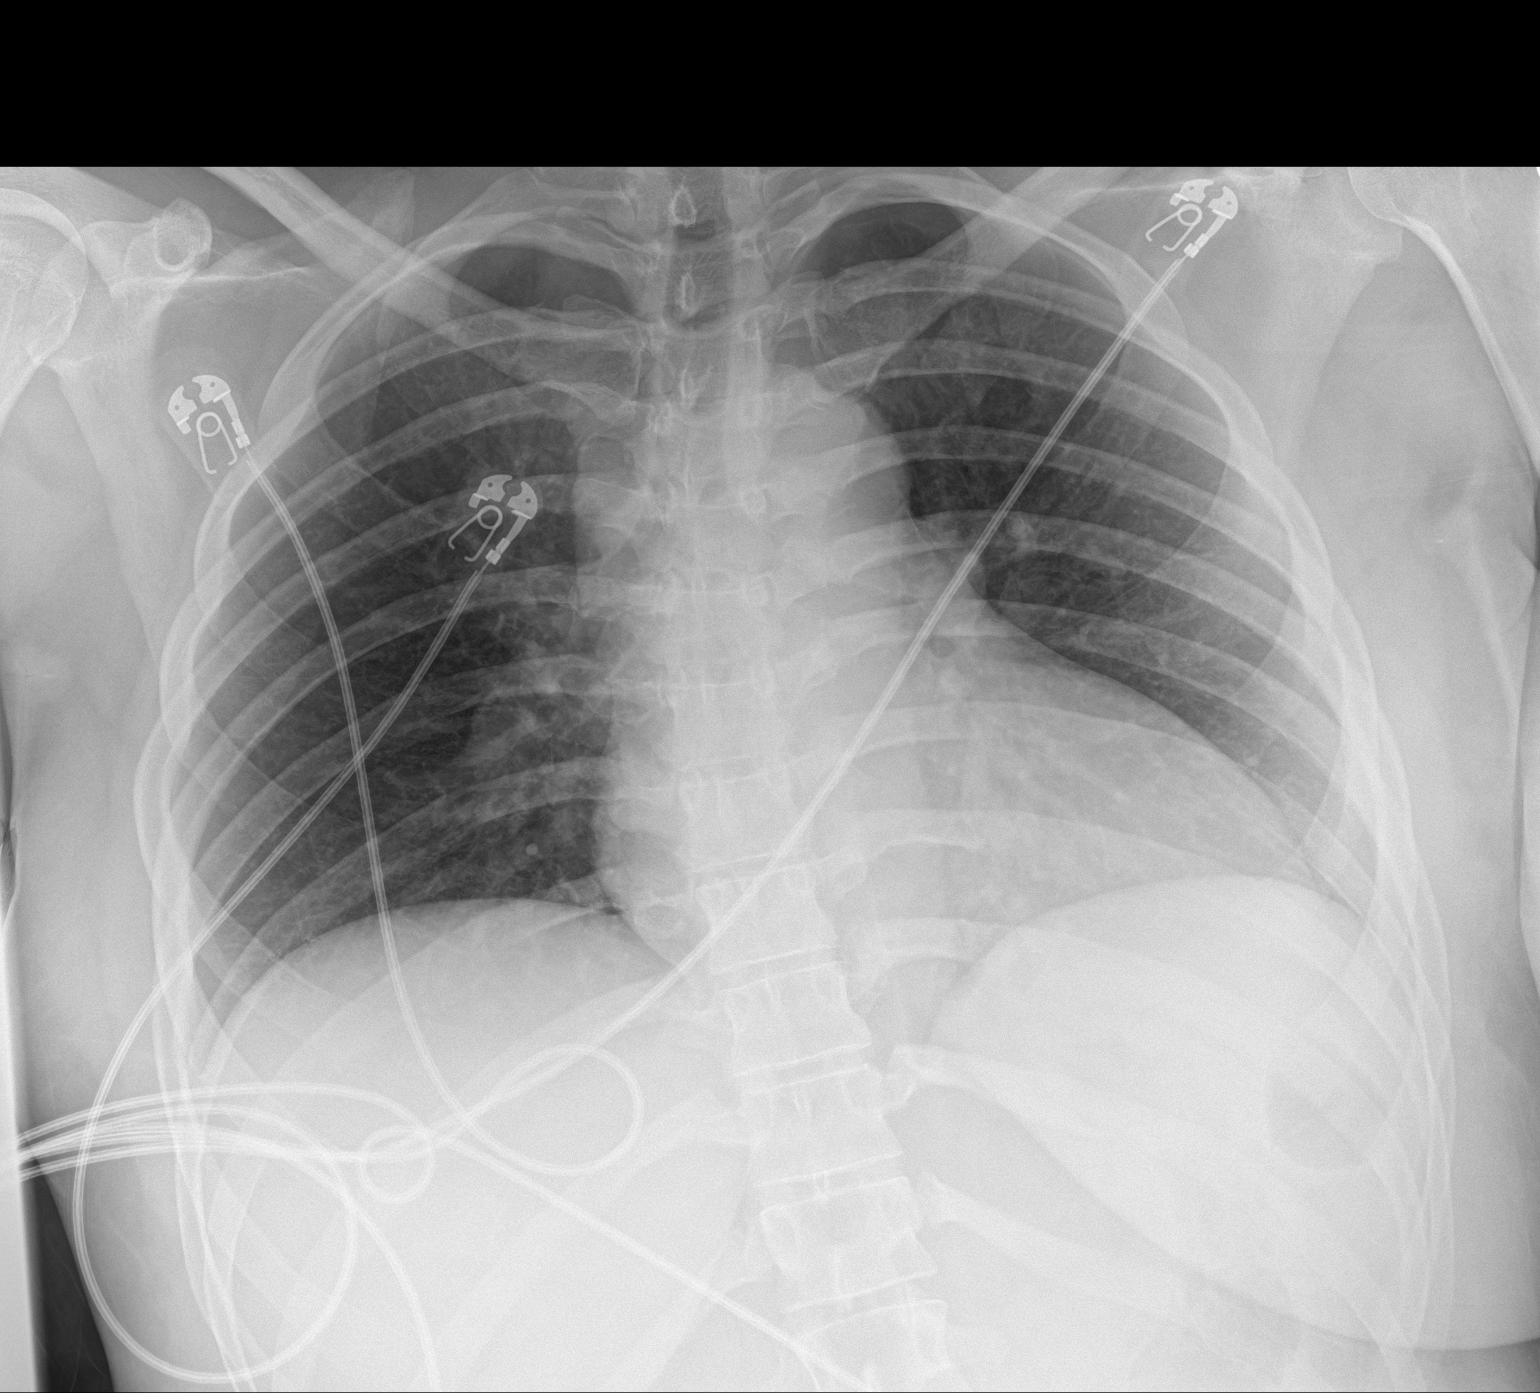

[1 of 1 positions shown; findings below may reference images not displayed]

FINDINGS: Low lung volumes. Upper normal heart size, likely accentuated by
technique. No pulmonary edema, consolidation, pleural fluid or
pneumothorax. No acute osseous abnormalities.
IMPRESSION: Hypoventilatory chest without acute abnormality.
